# Patient Record
Sex: Female | Born: 1985 | Race: Black or African American | Hispanic: No | Marital: Single
Health system: Midwestern US, Community
[De-identification: ages and names within clinical notes are randomized; demographics above are authoritative.]

## PROBLEM LIST (undated history)

## (undated) DIAGNOSIS — K219 Gastro-esophageal reflux disease without esophagitis: Secondary | ICD-10-CM

## (undated) DIAGNOSIS — F32A Depression, unspecified: Secondary | ICD-10-CM

## (undated) DIAGNOSIS — D649 Anemia, unspecified: Secondary | ICD-10-CM

## (undated) DIAGNOSIS — R011 Cardiac murmur, unspecified: Secondary | ICD-10-CM

## (undated) DIAGNOSIS — B009 Herpesviral infection, unspecified: Secondary | ICD-10-CM

## (undated) DIAGNOSIS — Z91148 Patient's other noncompliance with medication regimen for other reason: Secondary | ICD-10-CM

## (undated) DIAGNOSIS — Z9114 Patient's other noncompliance with medication regimen: Secondary | ICD-10-CM

## (undated) DIAGNOSIS — R51 Headache: Secondary | ICD-10-CM

## (undated) DIAGNOSIS — J439 Emphysema, unspecified: Secondary | ICD-10-CM

## (undated) DIAGNOSIS — T7840XA Allergy, unspecified, initial encounter: Secondary | ICD-10-CM

## (undated) HISTORY — DX: Cardiac murmur, unspecified: R01.1

## (undated) HISTORY — DX: Headache: R51

## (undated) HISTORY — DX: Gastro-esophageal reflux disease without esophagitis: K21.9

## (undated) HISTORY — DX: Allergy, unspecified, initial encounter: T78.40XA

## (undated) HISTORY — DX: Herpesviral infection, unspecified: B00.9

## (undated) HISTORY — DX: Emphysema, unspecified: J43.9

## (undated) HISTORY — DX: Depression, unspecified: F32.A

## (undated) HISTORY — DX: Anemia, unspecified: D64.9

---

## 2010-08-05 ENCOUNTER — Emergency Department (HOSPITAL_COMMUNITY): Admission: EM | Admit: 2010-08-05 | Discharge: 2010-08-05 | Payer: Self-pay | Admitting: Emergency Medicine

## 2011-07-01 LAB — HM PAP SMEAR: HM Pap smear: NORMAL

## 2011-10-13 ENCOUNTER — Emergency Department (HOSPITAL_COMMUNITY)
Admission: EM | Admit: 2011-10-13 | Discharge: 2011-10-13 | Disposition: A | Discharge status: Home / Self Care | Payer: No Typology Code available for payment source | Attending: Emergency Medicine | Admitting: Emergency Medicine

## 2011-10-13 ENCOUNTER — Emergency Department (HOSPITAL_COMMUNITY): Payer: No Typology Code available for payment source

## 2011-10-13 DIAGNOSIS — Z87891 Personal history of nicotine dependence: Secondary | ICD-10-CM | POA: Insufficient documentation

## 2011-10-13 DIAGNOSIS — J45909 Unspecified asthma, uncomplicated: Secondary | ICD-10-CM | POA: Insufficient documentation

## 2011-10-13 MED ORDER — METHYLPREDNISOLONE SODIUM SUCC 125 MG IJ SOLR
INTRAMUSCULAR | Status: AC
  Administered 2011-10-13: 125 mg
  Filled 2011-10-13: qty 2

## 2011-10-13 MED ORDER — ALBUTEROL (5 MG/ML) CONTINUOUS INHALATION SOLN
5.0000 mg/h | INHALATION_SOLUTION | Freq: Once | RESPIRATORY_TRACT | Status: AC
  Administered 2011-10-13: 5 mg/h via RESPIRATORY_TRACT

## 2011-10-13 MED ORDER — METHYLPREDNISOLONE SODIUM SUCC 125 MG IJ SOLR: 125.0000 mg | Freq: Once | INTRAMUSCULAR | Status: DC

## 2011-10-13 MED ORDER — ALBUTEROL SULFATE (5 MG/ML) 0.5% IN NEBU
5.0000 mg | INHALATION_SOLUTION | RESPIRATORY_TRACT | Status: DC
  Filled 2011-10-13: qty 1

## 2011-10-13 MED ORDER — ALBUTEROL (5 MG/ML) CONTINUOUS INHALATION SOLN
10.0000 mg/h | INHALATION_SOLUTION | RESPIRATORY_TRACT | Status: DC
  Administered 2011-10-13: 10 mg/h via RESPIRATORY_TRACT
  Filled 2011-10-13: qty 20

## 2011-10-13 MED ORDER — IPRATROPIUM BROMIDE 0.02 % IN SOLN
RESPIRATORY_TRACT | Status: AC
  Administered 2011-10-13: 0.5 mg
  Filled 2011-10-13: qty 2.5

## 2011-10-13 NOTE — ED Notes (Signed)
Pt said that mother performed cpr for 10 min, then ems arrived and "coded pt" yesterday

## 2011-10-13 NOTE — Progress Notes (Signed)
Pt much improved mild exp wheezes

## 2011-10-13 NOTE — ED Provider Notes (Signed)
History     CSN: [id number] Arrival date & time: 10/13/2011  3:37 AM   First MD Initiated Contact with Patient 10/13/11 0344      Chief Complaint  Patient presents with  . Asthma    (Consider location/radiation/quality/duration/timing/severity/associated sxs/prior treatment) HPI Comments: Seen 0345.Patient with h/o asthma, using inhaler as needed, developed wheezing and shortness of breath 2 days ago. Was in acute distress two days ago requiring EMS intervention and transport to [facility]. There she was evaluated, diagnosed with asthma and bronchitis and discharged with prednisone, zithromax, and proventil. Tonight began wheezing again. Did not respond to proventil.  Patient is a 25 y.o. female presenting with asthma. The history is provided by the patient.  Asthma This is a chronic (Wheezing and shortness of breath x 2 days, h/o asthma) problem. The current episode started 1 to 2 hours ago. The problem has not changed since onset.Associated symptoms include shortness of breath. Pertinent negatives include no chest pain and no abdominal pain. The symptoms are aggravated by coughing and exertion (lying down). The symptoms are relieved by medications (usually responds to proventil. Tonight did not respond). Treatments tried: bronchodilator. The treatment provided no relief.    Past Medical History  Diagnosis Date  . Asthma     History reviewed. No pertinent past surgical history.  No family history on file.  History  Substance Use Topics  . Smoking status: Former Smoker    Types: Cigarettes  . Smokeless tobacco: Not on file  . Alcohol Use: Yes     occ    OB History    Grav Para Term Preterm Abortions TAB SAB Ect Mult Living                  Review of Systems  Respiratory: Positive for shortness of breath and wheezing.   Cardiovascular: Negative for chest pain.  Gastrointestinal: Negative for abdominal pain.  All other systems reviewed and are  negative.    Allergies  Review of patient's allergies indicates no known allergies.  Home Medications   Current Outpatient Rx  Name Route Sig Dispense Refill  . ALBUTEROL SULFATE HFA 108 (90 BASE) MCG/ACT IN AERS Inhalation Inhale 2 puffs into the lungs every 6 (six) hours as needed.      Marland Kitchen LORATADINE 10 MG PO TABS Oral Take 10 mg by mouth daily.      Marland Kitchen NORGESTIMATE-ETH ESTRADIOL 0.25-35 MG-MCG PO TABS Oral Take 1 tablet by mouth daily.      Marland Kitchen PREDNISOLONE 5 MG PO TABS Oral Take by mouth.        BP 128/100  Pulse 78  Temp(Src) 97.8 F (36.6 C) (Oral)  Resp 24  Ht 5\' 3"  (1.6 m)  Wt 194 lb (87.998 kg)  BMI 34.37 kg/m2  SpO2 98%  LMP 10/06/2011  Physical Exam  Nursing note and vitals reviewed. Constitutional: She is oriented to person, place, and time. She appears well-developed and well-nourished. She appears distressed.  HENT:  Head: Normocephalic and atraumatic.  Mouth/Throat: Oropharynx is clear and moist.  Eyes: EOM are normal.  Neck: Normal range of motion. Neck supple.  Cardiovascular: Normal rate, normal heart sounds and intact distal pulses.   Pulmonary/Chest: She is in respiratory distress. She has wheezes.       Poor air movement. Wheezing throughout.  Abdominal: Soft.  Musculoskeletal: Normal range of motion.  Neurological: She is alert and oriented to person, place, and time. She has normal reflexes.  Skin: Skin is warm and dry.  ED Course  Procedures (including critical care time)  Dg Chest Portable 1 View  10/13/2011  *RADIOLOGY REPORT*  Clinical Data: Shortness of breath; history of asthma.  PORTABLE CHEST - 1 VIEW  Comparison: None.  Findings: The lungs are well-aerated.  Peribronchial thickening is noted.  There is no evidence of focal opacification, pleural effusion or pneumothorax.  The cardiomediastinal silhouette is within normal limits.  No acute osseous abnormalities are seen.  IMPRESSION: Peribronchial thickening likely reflects the patient's  asthma; lungs otherwise clear.  Original Report Authenticated By: Tonia Ghent, M.D.    MDM  Patient with h/o asthma developed wheezing and significant shortness of breath with wheezing two days ago. Seen at an outside hospital and begun on treatment for asthma/bronchitis with antibiotics, prednisone, preventil. Developed worsening wheezing and shortness of breath tonight. Received an albuterol tretment with little improvement. Placed on continuous neb with gradual improvement. O2 sats from 92% on RA to 98% on RA. Pt feels improved after observation and/or treatment in ED.Pt stable in ED with no significant deterioration in condition.The patient appears reasonably screened and/or stabilized for discharge and I doubt any other medical condition or other [facility] requiring further screening, evaluation, or treatment in the ED at this time prior to discharge. MDM Reviewed: nursing note, vitals and previous chart Reviewed previous: x-ray Interpretation: x-ray Total time providing critical care: 50.           Nicoletta Dress. Colon Branch, MD 10/13/11 [phone]

## 2011-10-13 NOTE — ED Notes (Signed)
Started having trouble breathing yesterday, "i quit breathing for 20 min and they had to revive me", was seen at [facility] -told she had bronchitis, given meds.  Was feeling better, then began having trouble breathing again tonight,   Out of breathing treatments for 1 month, pmd will not give refill

## 2011-11-08 ENCOUNTER — Ambulatory Visit (INDEPENDENT_AMBULATORY_CARE_PROVIDER_SITE_OTHER): Payer: PRIVATE HEALTH INSURANCE | Admitting: Internal Medicine

## 2011-11-08 ENCOUNTER — Encounter: Payer: Self-pay | Admitting: Internal Medicine

## 2011-11-08 ENCOUNTER — Ambulatory Visit (INDEPENDENT_AMBULATORY_CARE_PROVIDER_SITE_OTHER)
Admission: RE | Admit: 2011-11-08 | Discharge: 2011-11-08 | Disposition: A | Discharge status: Home / Self Care | Payer: PRIVATE HEALTH INSURANCE | Source: Ambulatory Visit | Attending: Internal Medicine | Admitting: Internal Medicine

## 2011-11-08 VITALS — BP 126/82 | HR 95 | Temp 98.4°F | Resp 20 | Ht 63.0 in | Wt 203.5 lb

## 2011-11-08 DIAGNOSIS — Z23 Encounter for immunization: Secondary | ICD-10-CM

## 2011-11-08 DIAGNOSIS — R059 Cough, unspecified: Secondary | ICD-10-CM | POA: Insufficient documentation

## 2011-11-08 DIAGNOSIS — B360 Pityriasis versicolor: Secondary | ICD-10-CM | POA: Insufficient documentation

## 2011-11-08 DIAGNOSIS — J45909 Unspecified asthma, uncomplicated: Secondary | ICD-10-CM | POA: Insufficient documentation

## 2011-11-08 DIAGNOSIS — R05 Cough: Secondary | ICD-10-CM

## 2011-11-08 DIAGNOSIS — J209 Acute bronchitis, unspecified: Secondary | ICD-10-CM

## 2011-11-08 DIAGNOSIS — J45901 Unspecified asthma with (acute) exacerbation: Secondary | ICD-10-CM | POA: Insufficient documentation

## 2011-11-08 HISTORY — DX: Pityriasis versicolor: B36.0

## 2011-11-08 MED ORDER — KETOCONAZOLE 200 MG PO TABS: 200.0000 mg | ORAL_TABLET | Freq: Every day | ORAL | Status: AC

## 2011-11-08 MED ORDER — FLUTICASONE-SALMETEROL 250-50 MCG/DOSE IN AEPB: 1.0000 | INHALATION_SPRAY | Freq: Two times a day (BID) | RESPIRATORY_TRACT | Status: DC

## 2011-11-08 MED ORDER — CEFUROXIME AXETIL 500 MG PO TABS: 500.0000 mg | ORAL_TABLET | Freq: Two times a day (BID) | ORAL | Status: AC

## 2011-11-08 NOTE — Assessment & Plan Note (Signed)
Start ketoconazole orally

## 2011-11-08 NOTE — Assessment & Plan Note (Signed)
I will check a CXR to look for pna, mass, edema 

## 2011-11-08 NOTE — Assessment & Plan Note (Signed)
Her cough is productive of yellow/green phlegm so i asked her to start ceftin for what sounds like a bacterial pna or bronchitis

## 2011-11-08 NOTE — Assessment & Plan Note (Signed)
She was recently admitted in [place] and has improved some on albuterol and po prednisone but I think she should be on a LABA and ICS so I gave her samples of advair diskus

## 2011-11-08 NOTE — Patient Instructions (Signed)

## 2011-11-08 NOTE — Progress Notes (Signed)
Subjective:    Patient ID: Teresa Peterson, female    DOB: Jun 10, 1986, 25 y.o.   MRN: [id number]  Cough This is a recurrent problem. The current episode started 1 to 4 weeks ago. The problem has been gradually worsening. The problem occurs every few hours. The cough is productive of purulent sputum. Associated symptoms include chills, a rash, shortness of breath and wheezing. Pertinent negatives include no chest pain, ear congestion, ear pain, eye redness, fever, headaches, heartburn, hemoptysis, myalgias, nasal congestion, postnasal drip, rhinorrhea, sore throat, sweats or weight loss. The symptoms are aggravated by cold air. She has tried oral steroids and a beta-agonist inhaler for the symptoms. The treatment provided moderate relief. Her past medical history is significant for asthma.  Rash This is a new problem. The current episode started more than 1 month ago. The problem has been gradually worsening since onset. The affected locations include the left arm, right arm, torso, back and chest. Rash characteristics: pale spots without itching or pain. She was exposed to nothing. Associated symptoms include coughing and shortness of breath. Pertinent negatives include no anorexia, congestion, diarrhea, eye pain, facial edema, fatigue, fever, joint pain, nail changes, rhinorrhea, sore throat or vomiting. Past treatments include nothing. Her past medical history is significant for asthma.      Review of Systems  Constitutional: Positive for chills. Negative for fever, weight loss, diaphoresis, activity change, appetite change, fatigue and unexpected weight change.  HENT: Negative for ear pain, congestion, sore throat, facial swelling, rhinorrhea, sneezing, neck pain, neck stiffness, postnasal drip and sinus pressure.   Eyes: Negative for photophobia, pain, discharge, redness and visual disturbance.  Respiratory: Positive for cough, shortness of breath and wheezing. Negative for apnea,  hemoptysis, choking, chest tightness and stridor.   Cardiovascular: Negative for chest pain, palpitations and leg swelling.  Gastrointestinal: Negative for heartburn, vomiting, abdominal pain, diarrhea, constipation and anorexia.  Genitourinary: Negative for dysuria, frequency, hematuria, enuresis, difficulty urinating, genital sores and dyspareunia.  Musculoskeletal: Negative for myalgias, back pain, joint pain, joint swelling, arthralgias and gait problem.  Skin: Positive for rash. Negative for nail changes.  Neurological: Negative for dizziness, tremors, seizures, syncope, facial asymmetry, speech difficulty, weakness, light-headedness, numbness and headaches.  Hematological: Negative for adenopathy. Does not bruise/bleed easily.  Psychiatric/Behavioral: Negative.        Objective:   Physical Exam  Vitals reviewed. Constitutional: She is oriented to person, place, and time. She appears well-developed and well-nourished. No distress.  HENT:  Head: Normocephalic and atraumatic.  Mouth/Throat: Oropharynx is clear and moist. No oropharyngeal exudate.  Eyes: Conjunctivae are normal. Right eye exhibits no discharge. Left eye exhibits no discharge. No scleral icterus.  Neck: Normal range of motion. Neck supple. No JVD present. No tracheal deviation present. No thyromegaly present.  Cardiovascular: Normal rate, regular rhythm, normal heart sounds and intact distal pulses.  Exam reveals no gallop and no friction rub.   No murmur heard. Pulmonary/Chest: Effort normal. No accessory muscle usage or stridor. Not tachypneic. No respiratory distress. She has no decreased breath sounds. She has wheezes in the right middle field and the left middle field. She has no rhonchi. She has no rales.  Abdominal: Soft. Bowel sounds are normal. She exhibits no distension and no mass. There is no tenderness. There is no rebound and no guarding.  Musculoskeletal: Normal range of motion. She exhibits no edema and no  tenderness.  Lymphadenopathy:    She has no cervical adenopathy.  Neurological: She is oriented to person, place,  and time. She displays normal reflexes. She exhibits normal muscle tone.  Skin: Skin is warm and dry. Rash noted. No abrasion, no bruising, no burn, no ecchymosis, no laceration, no lesion, no petechiae and no purpura noted. Rash is macular. Rash is not papular, not nodular, not pustular, not vesicular and not urticarial. She is not diaphoretic. No cyanosis or erythema. Nails show no clubbing.       She has diffuse polygonal depigmented macules scattered over her upper arms, torso, back, and chest  Psychiatric: She has a normal mood and affect. Her behavior is normal. Judgment and thought content normal.          Assessment & Plan:

## 2011-11-29 ENCOUNTER — Other Ambulatory Visit (INDEPENDENT_AMBULATORY_CARE_PROVIDER_SITE_OTHER): Payer: PRIVATE HEALTH INSURANCE

## 2011-11-29 ENCOUNTER — Encounter: Payer: Self-pay | Admitting: Internal Medicine

## 2011-11-29 ENCOUNTER — Ambulatory Visit (INDEPENDENT_AMBULATORY_CARE_PROVIDER_SITE_OTHER): Payer: PRIVATE HEALTH INSURANCE | Admitting: Internal Medicine

## 2011-11-29 DIAGNOSIS — R079 Chest pain, unspecified: Secondary | ICD-10-CM

## 2011-11-29 DIAGNOSIS — R0602 Shortness of breath: Secondary | ICD-10-CM

## 2011-11-29 DIAGNOSIS — J45901 Unspecified asthma with (acute) exacerbation: Secondary | ICD-10-CM

## 2011-11-29 DIAGNOSIS — R05 Cough: Secondary | ICD-10-CM

## 2011-11-29 DIAGNOSIS — B360 Pityriasis versicolor: Secondary | ICD-10-CM

## 2011-11-29 LAB — CBC WITH DIFFERENTIAL/PLATELET
Basophils Absolute: 0 10*3/uL (ref 0.0–0.1)
Basophils Relative: 0.5 % (ref 0.0–3.0)
Eosinophils Absolute: 1.3 10*3/uL — ABNORMAL HIGH (ref 0.0–0.7)
Eosinophils Relative: 17.1 % — ABNORMAL HIGH (ref 0.0–5.0)
HCT: 36.5 % (ref 36.0–46.0)
Hemoglobin: 12 g/dL (ref 12.0–15.0)
Lymphocytes Relative: 25.3 % (ref 12.0–46.0)
Lymphs Abs: 1.9 10*3/uL (ref 0.7–4.0)
MCHC: 32.8 g/dL (ref 30.0–36.0)
MCV: 88.6 fl (ref 78.0–100.0)
Monocytes Absolute: 0.6 10*3/uL (ref 0.1–1.0)
Monocytes Relative: 8.6 % (ref 3.0–12.0)
Neutro Abs: 3.6 10*3/uL (ref 1.4–7.7)
Neutrophils Relative %: 48.5 % (ref 43.0–77.0)
Platelets: 332 10*3/uL (ref 150.0–400.0)
RBC: 4.12 Mil/uL (ref 3.87–5.11)
RDW: 13.6 % (ref 11.5–14.6)
WBC: 7.4 10*3/uL (ref 4.5–10.5)

## 2011-11-29 LAB — COMPREHENSIVE METABOLIC PANEL
ALT: 25 U/L (ref 0–35)
AST: 26 U/L (ref 0–37)
Alkaline Phosphatase: 49 U/L (ref 39–117)
Creatinine, Ser: 0.7 mg/dL (ref 0.4–1.2)
Sodium: 140 mEq/L (ref 135–145)
Total Bilirubin: 0.7 mg/dL (ref 0.3–1.2)
Total Protein: 7.2 g/dL (ref 6.0–8.3)

## 2011-11-29 LAB — BRAIN NATRIURETIC PEPTIDE: Pro B Natriuretic peptide (BNP): 5 pg/mL (ref 0.0–100.0)

## 2011-11-29 LAB — TSH: TSH: 1.35 u[IU]/mL (ref 0.35–5.50)

## 2011-11-29 MED ORDER — ALBUTEROL SULFATE HFA 108 (90 BASE) MCG/ACT IN AERS: 2.0000 | INHALATION_SPRAY | Freq: Four times a day (QID) | RESPIRATORY_TRACT | Status: DC | PRN

## 2011-11-29 MED ORDER — MONTELUKAST SODIUM 10 MG PO TABS: 10.0000 mg | ORAL_TABLET | Freq: Every day | ORAL | Status: DC

## 2011-11-29 MED ORDER — BUDESONIDE-FORMOTEROL FUMARATE 160-4.5 MCG/ACT IN AERO: 2.0000 | INHALATION_SPRAY | Freq: Two times a day (BID) | RESPIRATORY_TRACT | Status: DC

## 2011-11-29 NOTE — Assessment & Plan Note (Signed)
Her recent CXR was normal so I think the cough is related to asthma

## 2011-11-29 NOTE — Assessment & Plan Note (Signed)
Her EKG is normal so I don't think her pain is cardiac in nature, she has risk factors for PE so will check a d-dimer, also will check a BNP to look for fluid overload

## 2011-11-29 NOTE — Assessment & Plan Note (Signed)
The SOB may be related to the asthma but she feels like something else may be going on so I will check her for anemia, renal failure, hypothyroidism, etc

## 2011-11-29 NOTE — Assessment & Plan Note (Signed)
She has persistent symptoms so I will add singulair and she has only been using the albuterol about 1x per day so I asked her to try using that more frequently, also will change to symbicort at her request

## 2011-11-29 NOTE — Patient Instructions (Signed)

## 2011-11-29 NOTE — Assessment & Plan Note (Signed)
This has resolved.

## 2011-11-29 NOTE — Progress Notes (Signed)
Subjective:    Patient ID: Teresa Peterson, female    DOB: 09-Sep-1986, 25 y.o.   MRN: [id number]  Chest Pain  This is a new problem. Episode onset: for 2 weeks. The onset quality is gradual. The problem occurs intermittently. The problem has been unchanged. The pain is present in the lateral region. The pain is at a severity of 1/10. The pain is mild. The quality of the pain is described as sharp. The pain does not radiate. Associated symptoms include a cough, malaise/fatigue and shortness of breath. Pertinent negatives include no abdominal pain, back pain, claudication, diaphoresis, dizziness, exertional chest pressure, fever, headaches, hemoptysis, irregular heartbeat, leg pain, lower extremity edema, nausea, near-syncope, numbness, orthopnea, palpitations, PND, sputum production, syncope, vomiting or weakness. The cough is non-productive. The cough is relieved by a beta-agonist. The pain is aggravated by nothing. She has tried nothing for the symptoms. The treatment provided mild relief. Risk factors include oral contraceptive use, sedentary lifestyle and obesity.  Pertinent negatives for past medical history include no seizures.  Asthma She complains of chest tightness, cough, difficulty breathing, shortness of breath and wheezing. There is no hemoptysis, hoarse voice or sputum production. This is a recurrent problem. The current episode started more than 1 month ago. The problem occurs intermittently. The problem has been gradually worsening. The cough is non-productive. Associated symptoms include chest pain, dyspnea on exertion and malaise/fatigue. Pertinent negatives include no appetite change, ear congestion, ear pain, fever, headaches, heartburn, myalgias, nasal congestion, orthopnea, PND, postnasal drip, rhinorrhea, sneezing, sore throat, sweats, trouble swallowing or weight loss. Her symptoms are aggravated by nothing. Her symptoms are alleviated by beta-agonist. She reports minimal  improvement on treatment. Her past medical history is significant for asthma.      Review of Systems  Constitutional: Positive for malaise/fatigue. Negative for fever, chills, weight loss, diaphoresis, activity change, appetite change, fatigue and unexpected weight change.  HENT: Negative for ear pain, sore throat, hoarse voice, facial swelling, rhinorrhea, sneezing, trouble swallowing, neck pain, neck stiffness, voice change, postnasal drip and sinus pressure.   Eyes: Negative.   Respiratory: Positive for cough, chest tightness, shortness of breath and wheezing. Negative for apnea, hemoptysis, sputum production, choking and stridor.   Cardiovascular: Positive for chest pain and dyspnea on exertion. Negative for palpitations, orthopnea, claudication, syncope, PND and near-syncope.  Gastrointestinal: Negative for heartburn, nausea, vomiting, abdominal pain, diarrhea, constipation and blood in stool.  Genitourinary: Negative for dysuria, urgency, frequency, hematuria, decreased urine volume, enuresis, difficulty urinating and dyspareunia.  Musculoskeletal: Negative for myalgias, back pain, joint swelling, arthralgias and gait problem.  Skin: Negative for color change, pallor, rash and wound.  Neurological: Negative for dizziness, tremors, seizures, syncope, facial asymmetry, speech difficulty, weakness, light-headedness, numbness and headaches.  Hematological: Negative for adenopathy. Does not bruise/bleed easily.  Psychiatric/Behavioral: Negative.        Objective:   Physical Exam  Vitals reviewed. Constitutional: She is oriented to person, place, and time. She appears well-developed and well-nourished. No distress.  HENT:  Head: Normocephalic and atraumatic.  Mouth/Throat: Oropharynx is clear and moist. No oropharyngeal exudate.  Eyes: Conjunctivae are normal. Right eye exhibits no discharge. Left eye exhibits no discharge. No scleral icterus.  Neck: Normal range of motion. Neck supple.  No JVD present. No tracheal deviation present. No thyromegaly present.  Cardiovascular: Normal rate, regular rhythm, normal heart sounds and intact distal pulses.  Exam reveals no gallop and no friction rub.   No murmur heard. Pulmonary/Chest: Effort normal and breath sounds  normal. No stridor. No respiratory distress. She has no wheezes. She has no rales. She exhibits no tenderness.  Abdominal: Soft. Bowel sounds are normal. She exhibits no distension and no mass. There is no tenderness. There is no rebound and no guarding.  Musculoskeletal: Normal range of motion. She exhibits no edema and no tenderness.  Lymphadenopathy:    She has no cervical adenopathy.  Neurological: She is oriented to person, place, and time.  Skin: Skin is warm and dry. No rash noted. She is not diaphoretic. No erythema. No pallor.  Psychiatric: She has a normal mood and affect. Her behavior is normal. Judgment and thought content normal.          Assessment & Plan:

## 2011-12-01 ENCOUNTER — Encounter: Payer: Self-pay | Admitting: Internal Medicine

## 2012-04-02 ENCOUNTER — Encounter (HOSPITAL_COMMUNITY): Payer: Self-pay | Admitting: *Deleted

## 2012-04-02 ENCOUNTER — Emergency Department (HOSPITAL_COMMUNITY)
Admission: EM | Admit: 2012-04-02 | Discharge: 2012-04-02 | Disposition: A | Discharge status: Home / Self Care | Payer: Self-pay | Attending: Emergency Medicine | Admitting: Emergency Medicine

## 2012-04-02 DIAGNOSIS — J438 Other emphysema: Secondary | ICD-10-CM | POA: Insufficient documentation

## 2012-04-02 DIAGNOSIS — Z87891 Personal history of nicotine dependence: Secondary | ICD-10-CM | POA: Insufficient documentation

## 2012-04-02 DIAGNOSIS — R0602 Shortness of breath: Secondary | ICD-10-CM | POA: Insufficient documentation

## 2012-04-02 DIAGNOSIS — J45909 Unspecified asthma, uncomplicated: Secondary | ICD-10-CM | POA: Insufficient documentation

## 2012-04-02 DIAGNOSIS — Z9114 Patient's other noncompliance with medication regimen: Secondary | ICD-10-CM

## 2012-04-02 MED ORDER — IPRATROPIUM BROMIDE 0.02 % IN SOLN: 0.5000 mg | Freq: Once | RESPIRATORY_TRACT | Status: DC

## 2012-04-02 MED ORDER — ALBUTEROL SULFATE (5 MG/ML) 0.5% IN NEBU: 5.0000 mg | INHALATION_SOLUTION | Freq: Once | RESPIRATORY_TRACT | Status: DC

## 2012-04-02 NOTE — ED Provider Notes (Cosign Needed)
History     CSN: [id number]  Arrival date & time 04/02/12  1478   First MD Initiated Contact with Patient 04/02/12 1920      Chief Complaint  Patient presents with  . Shortness of Breath    (Consider location/radiation/quality/duration/timing/severity/associated sxs/prior treatment) HPI Patient with symptoms of sinus problems and seasonal allergies with feeling like she is going to have an asthma attack for two week.  Out of inhaler for a month.  Takes zyrtec for seasonal allergies.  Cough with white sputum, no fever.  No nausea , vomiting or diarrhea.  PMD- none. Normal menses, no pregnancies, no bcp, lmp two weeks ago.  Past Medical History  Diagnosis Date  . Asthma   . Emphysema of lung   . Heart murmur   . Headache     Migraines    History reviewed. No pertinent past surgical history.  Family History  Problem Relation Age of Onset  . Alcohol abuse Other     grandparents  . Arthritis Other     grandparent  . Cancer Other     relative  . Stroke Other     grandparent  . Kidney disease Other     grandparents  . Diabetes Other     grandparent  . Asthma Brother     History  Substance Use Topics  . Smoking status: Former Smoker    Types: Cigarettes    Quit date: 07/08/2011  . Smokeless tobacco: Not on file  . Alcohol Use: No     occ    OB History    Grav Para Term Preterm Abortions TAB SAB Ect Mult Living                  Review of Systems  All other systems reviewed and are negative.    Allergies  Review of patient's allergies indicates no known allergies.  Home Medications   Current Outpatient Rx  Name Route Sig Dispense Refill  . ALBUTEROL SULFATE HFA 108 (90 BASE) MCG/ACT IN AERS Inhalation Inhale 2 puffs into the lungs every 6 (six) hours as needed. 1 Inhaler 11  . BUDESONIDE-FORMOTEROL FUMARATE 160-4.5 MCG/ACT IN AERO Inhalation Inhale 2 puffs into the lungs 2 (two) times daily. 1 Inhaler 0  . KETOCONAZOLE 200 MG PO TABS Oral Take 1  tablet (200 mg total) by mouth daily. 3 tablet 1  . LORATADINE 10 MG PO TABS Oral Take 10 mg by mouth daily.      . METHYLPREDNISOLONE 4 MG PO KIT Oral Take 4 mg by mouth 2 (two) times daily. follow package directions     . MONTELUKAST SODIUM 10 MG PO TABS Oral Take 1 tablet (10 mg total) by mouth daily. 30 tablet 11  . NORGESTIMATE-ETH ESTRADIOL 0.25-35 MG-MCG PO TABS Oral Take 1 tablet by mouth daily.      Marland Kitchen PREDNISOLONE 5 MG PO TABS Oral Take 10 mg by mouth 2 (two) times daily.       BP 113/91  Pulse 87  Temp(Src) 97.9 F (36.6 C) (Oral)  Resp 18  Ht 5\' 4"  (1.626 m)  Wt 200 lb (90.719 kg)  BMI 34.33 kg/m2  SpO2 93%  LMP 03/12/2012  Physical Exam  Nursing note and vitals reviewed. Constitutional: She appears well-developed and well-nourished.       Morbidly obese  HENT:  Head: Normocephalic and atraumatic.  Eyes: Conjunctivae are normal.  Neck: Normal range of motion. Neck supple.  Pulmonary/Chest: Breath sounds normal.    ED  Course  Procedures (including critical care time)  Labs Reviewed - No data to display No results found.   No diagnosis found.    MDM  Mother became angry during exam and stated that my facial expressions were rude.  I explained that I was only trying to show the patient what I was asking when I instructed her to open her mouth for deep breaths. She states these were unprofessional and "can't you see she's in respiratory distress?"  Patient states it hurst her throat to breath through it- I asked if she has had a sore throat, and she replied "it's burning".  Mother states we will go to [facility] and left with patient.        Hilario Quarry, MD 04/02/12 [phone]

## 2012-04-02 NOTE — ED Notes (Signed)
Pt reports sob for the last 2 weeks. States does not have a PCP, & no meds being prescribed to her.

## 2012-04-02 NOTE — ED Notes (Signed)
Returned to desk to find pt left after speaking to the EDP. Pt did not wait for breathing tx that had been ordered. Pt left & was unavailiable to sign AMA form.

## 2012-04-02 NOTE — ED Notes (Signed)
Respiratory paged for a breathing treatment at this time.  

## 2012-04-02 NOTE — ED Notes (Signed)
Pt left AMA after speaking w/ EDP.

## 2012-04-02 NOTE — ED Notes (Signed)
Short of breath for the past 2 weeks 

## 2015-07-25 ENCOUNTER — Encounter (HOSPITAL_COMMUNITY): Payer: Self-pay | Admitting: Cardiology

## 2015-07-25 ENCOUNTER — Emergency Department (HOSPITAL_COMMUNITY)
Admission: EM | Admit: 2015-07-25 | Discharge: 2015-07-25 | Disposition: A | Discharge status: Home / Self Care | Payer: PRIVATE HEALTH INSURANCE | Attending: Emergency Medicine | Admitting: Emergency Medicine

## 2015-07-25 DIAGNOSIS — R011 Cardiac murmur, unspecified: Secondary | ICD-10-CM | POA: Insufficient documentation

## 2015-07-25 DIAGNOSIS — S46911A Strain of unspecified muscle, fascia and tendon at shoulder and upper arm level, right arm, initial encounter: Secondary | ICD-10-CM | POA: Diagnosis not present

## 2015-07-25 DIAGNOSIS — Y998 Other external cause status: Secondary | ICD-10-CM | POA: Diagnosis not present

## 2015-07-25 DIAGNOSIS — Y9389 Activity, other specified: Secondary | ICD-10-CM | POA: Insufficient documentation

## 2015-07-25 DIAGNOSIS — Z79899 Other long term (current) drug therapy: Secondary | ICD-10-CM | POA: Diagnosis not present

## 2015-07-25 DIAGNOSIS — Z7951 Long term (current) use of inhaled steroids: Secondary | ICD-10-CM | POA: Diagnosis not present

## 2015-07-25 DIAGNOSIS — J45909 Unspecified asthma, uncomplicated: Secondary | ICD-10-CM | POA: Diagnosis not present

## 2015-07-25 DIAGNOSIS — X58XXXA Exposure to other specified factors, initial encounter: Secondary | ICD-10-CM | POA: Insufficient documentation

## 2015-07-25 DIAGNOSIS — Y9289 Other specified places as the place of occurrence of the external cause: Secondary | ICD-10-CM | POA: Diagnosis not present

## 2015-07-25 DIAGNOSIS — Z7952 Long term (current) use of systemic steroids: Secondary | ICD-10-CM | POA: Insufficient documentation

## 2015-07-25 DIAGNOSIS — Z87891 Personal history of nicotine dependence: Secondary | ICD-10-CM | POA: Diagnosis not present

## 2015-07-25 DIAGNOSIS — S4991XA Unspecified injury of right shoulder and upper arm, initial encounter: Secondary | ICD-10-CM | POA: Diagnosis present

## 2015-07-25 MED ORDER — KETOROLAC TROMETHAMINE 10 MG PO TABS
10.0000 mg | ORAL_TABLET | Freq: Once | ORAL | Status: AC
  Administered 2015-07-25: 10 mg via ORAL
  Filled 2015-07-25: qty 1

## 2015-07-25 MED ORDER — IBUPROFEN 600 MG PO TABS: 600.0000 mg | ORAL_TABLET | Freq: Four times a day (QID) | ORAL | Status: DC

## 2015-07-25 MED ORDER — ACETAMINOPHEN 325 MG PO TABS
650.0000 mg | ORAL_TABLET | Freq: Once | ORAL | Status: AC
  Administered 2015-07-25: 650 mg via ORAL
  Filled 2015-07-25: qty 2

## 2015-07-25 MED ORDER — METHOCARBAMOL 500 MG PO TABS
1000.0000 mg | ORAL_TABLET | Freq: Once | ORAL | Status: AC
  Administered 2015-07-25: 1000 mg via ORAL
  Filled 2015-07-25: qty 2

## 2015-07-25 MED ORDER — METHOCARBAMOL 500 MG PO TABS: 500.0000 mg | ORAL_TABLET | Freq: Three times a day (TID) | ORAL | Status: DC

## 2015-07-25 NOTE — ED Notes (Signed)
Right shoulder  Pain since 6 am.

## 2015-07-25 NOTE — ED Provider Notes (Signed)
CSN: [id number]     Arrival date & time 07/25/15  8295 History   First MD Initiated Contact with Patient 07/25/15 0900     Chief Complaint  Patient presents with  . Shoulder Pain     (Consider location/radiation/quality/duration/timing/severity/associated sxs/prior Treatment) HPI Comments: Pt reports lifting heavy objects on yesterday. She has pain in the right shoulder  increasing since that time  Patient is a 29 y.o. female presenting with shoulder pain. The history is provided by the patient.  Shoulder Pain Location:  Shoulder Time since incident:  3 hours Injury: no   Shoulder location:  R shoulder Pain details:    Quality:  Aching   Severity:  Moderate   Onset quality:  Gradual   Duration:  3 hours   Timing:  Intermittent   Progression:  Worsening Chronicity:  New Handedness:  Right-handed Dislocation: no   Prior injury to area:  No Relieved by:  Being still Worsened by:  Movement Ineffective treatments:  None tried Associated symptoms: stiffness   Associated symptoms: no back pain, no fever, no neck pain, no numbness and no tingling   Risk factors: no known bone disorder, no frequent fractures and no recent illness     Past Medical History  Diagnosis Date  . Asthma   . Emphysema of lung   . Heart murmur   . Headache(784.0)     Migraines   History reviewed. No pertinent past surgical history. Family History  Problem Relation Age of Onset  . Alcohol abuse Other     grandparents  . Arthritis Other     grandparent  . Cancer Other     relative  . Stroke Other     grandparent  . Kidney disease Other     grandparents  . Diabetes Other     grandparent  . Asthma Brother    History  Substance Use Topics  . Smoking status: Former Smoker    Types: Cigarettes    Quit date: 07/08/2011  . Smokeless tobacco: Not on file  . Alcohol Use: No     Comment: occ   OB History    No data available     Review of Systems  Constitutional: Negative for fever.   Musculoskeletal: Positive for arthralgias and stiffness. Negative for back pain and neck pain.  Neurological: Positive for headaches.  All other systems reviewed and are negative.     Allergies  Sudafed  Home Medications   Prior to Admission medications   Medication Sig Start Date End Date Taking? Authorizing Provider  albuterol (PROVENTIL HFA;VENTOLIN HFA) 108 (90 BASE) MCG/ACT inhaler Inhale 2 puffs into the lungs every 6 (six) hours as needed. 11/29/11   Etta Grandchild, MD  budesonide-formoterol [facility]) 160-4.5 MCG/ACT inhaler Inhale 2 puffs into the lungs 2 (two) times daily. 11/29/11 11/28/12  Etta Grandchild, MD  loratadine (CLARITIN) 10 MG tablet Take 10 mg by mouth daily.      Historical Provider, MD  methylPREDNISolone (MEDROL DOSEPAK) 4 MG tablet Take 4 mg by mouth 2 (two) times daily. follow package directions     Historical Provider, MD  montelukast (SINGULAIR) 10 MG tablet Take 1 tablet (10 mg total) by mouth daily. 11/29/11 11/28/12  Etta Grandchild, MD  norgestimate-ethinyl estradiol (ORTHO-CYCLEN,SPRINTEC,PREVIFEM) 0.25-35 MG-MCG tablet Take 1 tablet by mouth daily.      Historical Provider, MD  prednisoLONE 5 MG TABS Take 10 mg by mouth 2 (two) times daily.     Historical Provider, MD  BP 129/61 mmHg  Pulse 84  Temp(Src) 98.3 F (36.8 C) (Oral)  Resp 16  Ht  (1.6 m)  Wt 188 lb (85.276 kg)  BMI 33.31 kg/m2  SpO2 98%  LMP 07/24/2015 Physical Exam  Constitutional: She is oriented to person, place, and time. She appears well-developed and well-nourished.  Non-toxic appearance.  HENT:  Head: Normocephalic.  Right Ear: Tympanic membrane and external ear normal.  Left Ear: Tympanic membrane and external ear normal.  Eyes: EOM and lids are normal. Pupils are equal, round, and reactive to light.  Neck: Normal range of motion. Neck supple. Carotid bruit is not present.  Cardiovascular: Normal rate, regular rhythm, normal heart sounds, intact distal pulses and  normal pulses.   Pulmonary/Chest: Breath sounds normal. No respiratory distress.  Abdominal: Soft. Bowel sounds are normal. There is no tenderness. There is no guarding.  Musculoskeletal:       Right shoulder: She exhibits decreased range of motion, tenderness and spasm. She exhibits no effusion and no deformity.  Right shoulder is not hot. Pain of the right anterior shoulder, extending to the lower neck.  Lymphadenopathy:       Head (right side): No submandibular adenopathy present.       Head (left side): No submandibular adenopathy present.    She has no cervical adenopathy.  Neurological: She is alert and oriented to person, place, and time. She has normal strength. No cranial nerve deficit or sensory deficit.  Skin: Skin is warm and dry.  Psychiatric: She has a normal mood and affect. Her speech is normal.  Nursing note and vitals reviewed.   ED Course  Procedures (including critical care time) Labs Review Labs Reviewed - No data to display  Imaging Review No results found.   EKG Interpretation None      MDM  Examination favors shoulder strain. Vital signs are within normal limits. No gross neurovascular deficits appreciated. Patient is fitted with a sling, she will be treated with ibuprofen 600 mg 4 times a day, Robaxin, and she will use Tylenol in between the ibuprofen doses if needed for discomfort. Patient is referred to orthopedics for additional evaluation of her shoulder pain. Work note is given for the patient to return on Friday, August 5 .   Final diagnoses:  None    *I have reviewed nursing notes, vital signs, and all appropriate lab and imaging results for this patient.[address], PA-C 07/25/15 9528  Blane Ohara, MD 07/25/15 [phone]

## 2015-07-25 NOTE — Discharge Instructions (Signed)
Please use your sling over the next 3 or 4 days. Please see the orthopedic specialist listed above, or the orthopedic specialist of your choice if pain is not improving. Please use ibuprofen 600 mg with breakfast, lunch, dinner, and bedtime. Use Tylenol Extra Strength in between the ibuprofen doses if needed. Please use Robaxin 3 times daily for the muscle spasm and pain related to your shoulder issue. Robaxin may cause drowsiness, please do not drive, drink alcohol, operate machinery, or participate in activities requiring concentration when taking this medication. Shoulder Sprain A shoulder sprain is the result of damage to the tough, fiber-like tissues (ligaments) that help hold your shoulder in place. The ligaments may be stretched or torn. Besides the main shoulder joint (the ball and socket), there are several smaller joints that connect the bones in this area. A sprain usually involves one of those joints. Most often it is the acromioclavicular (or AC) joint. That is the joint that connects the collarbone (clavicle) and the shoulder blade (scapula) at the top point of the shoulder blade (acromion). A shoulder sprain is a mild form of what is called a shoulder separation. Recovering from a shoulder sprain may take some time. For some, pain lingers for several months. Most people recover without long term problems. CAUSES   A shoulder sprain is usually caused by some kind of trauma. This might be:  Falling on an outstretched arm.  Being hit hard on the shoulder.  Twisting the arm.  Shoulder sprains are more likely to occur in people who:  Play sports.  Have balance or coordination problems. SYMPTOMS   Pain when you move your shoulder.  Limited ability to move the shoulder.  Swelling and tenderness on top of the shoulder.  Redness or warmth in the shoulder.  Bruising.  A change in the shape of the shoulder. DIAGNOSIS  Your healthcare provider may:  Ask about your  symptoms.  Ask about recent activity that might have caused those symptoms.  Examine your shoulder. You may be asked to do simple exercises to test movement. The other shoulder will be examined for comparison.  Order some tests that provide a look inside the body. They can show the extent of the injury. The tests could include:  X-rays.  CT (computed tomography) scan.  MRI (magnetic resonance imaging) scan. RISKS AND COMPLICATIONS  Loss of full shoulder motion.  Ongoing shoulder pain. TREATMENT  How long it takes to recover from a shoulder sprain depends on how severe it was. Treatment options may include:  Rest. You should not use the arm or shoulder until it heals.  Ice. For 2 or 3 days after the injury, put an ice pack on the shoulder up to 4 times a day. It should stay on for 15 to 20 minutes each time. Wrap the ice in a towel so it does not touch your skin.  Over-the-counter medicine to relieve pain.  A sling or brace. This will keep the arm still while the shoulder is healing.  Physical therapy or rehabilitation exercises. These will help you regain strength and motion. Ask your healthcare provider when it is OK to begin these exercises.  Surgery. The need for surgery is rare with a sprained shoulder, but some people may need surgery to keep the joint in place and reduce pain. HOME CARE INSTRUCTIONS   Ask your healthcare provider about what you should and should not do while your shoulder heals.  Make sure you know how to apply ice to the  correct area of your shoulder.  Talk with your healthcare provider about which medications should be used for pain and swelling.  If rehabilitation therapy will be needed, ask your healthcare provider to refer you to a therapist. If it is not recommended, then ask about at-home exercises. Find out when exercise should begin. SEEK MEDICAL CARE IF:  Your pain, swelling, or redness at the joint increases. SEEK IMMEDIATE MEDICAL CARE IF:    You have a fever.  You cannot move your arm or shoulder. Document Released: 04/27/2009 Document Revised: 03/02/2012 Document Reviewed: 04/27/2009 [facility] Patient Information 2015 [place], [place]. This information is not intended to replace advice given to you by your health care provider. Make sure you discuss any questions you have with your health care provider.

## 2015-08-14 ENCOUNTER — Encounter: Payer: Self-pay | Admitting: Orthopedic Surgery

## 2015-08-17 ENCOUNTER — Ambulatory Visit: Payer: PRIVATE HEALTH INSURANCE | Admitting: Orthopedic Surgery

## 2015-09-07 ENCOUNTER — Ambulatory Visit (INDEPENDENT_AMBULATORY_CARE_PROVIDER_SITE_OTHER): Payer: PRIVATE HEALTH INSURANCE

## 2015-09-07 ENCOUNTER — Ambulatory Visit (INDEPENDENT_AMBULATORY_CARE_PROVIDER_SITE_OTHER): Payer: PRIVATE HEALTH INSURANCE | Admitting: Orthopedic Surgery

## 2015-09-07 ENCOUNTER — Encounter: Payer: Self-pay | Admitting: Orthopedic Surgery

## 2015-09-07 VITALS — BP 152/93 | Ht 63.0 in | Wt 188.0 lb

## 2015-09-07 DIAGNOSIS — M25511 Pain in right shoulder: Secondary | ICD-10-CM

## 2015-09-07 DIAGNOSIS — M75101 Unspecified rotator cuff tear or rupture of right shoulder, not specified as traumatic: Secondary | ICD-10-CM

## 2015-09-07 MED ORDER — NAPROXEN 500 MG PO TABS: 500.0000 mg | ORAL_TABLET | Freq: Two times a day (BID) | ORAL | Status: DC

## 2015-09-07 MED ORDER — METHOCARBAMOL 500 MG PO TABS: 500.0000 mg | ORAL_TABLET | Freq: Three times a day (TID) | ORAL | Status: DC | PRN

## 2015-09-07 NOTE — Progress Notes (Signed)
Patient ID: Teresa Teresa Peterson, female   DOB: 10-May-1986, 29 y.o.   MRN: [id number]  Chief Complaint  Patient presents with  . Shoulder Pain    ER follow up for right shoulder pain.    HPI Teresa DEVENPORT is a 29 y.o. female.  The patient presents with a long history of difficulties with his right shoulder. This first began 6 years ago she was fighting with her brother playfully he pushed her arm she felt severe pain it was sore for a few weeks and went away. Then she fell on her job last year had some shoulder pain for about a month that resolved. On this occasion after unloading a truck she started having right shoulder pain presents to the ER complaining of. Acromial dull aching severe pain acute onset. She has pain when she brings her arm down from Teresa Peterson elevated position. No previous treatment other than methocarbamol and ibuprofen seeming to get more relief from the muscle relaxer  She denies fever chills numbness or tingling under review of systems HPI  Review of Systems Review of Systems   Past Medical History  Diagnosis Date  . Asthma   . Emphysema of lung   . Heart murmur   . Headache(784.0)     Migraines    No past surgical history on file.  Family History  Problem Relation Age of Onset  . Alcohol abuse Other     grandparents  . Arthritis Other     grandparent  . Cancer Other     relative  . Stroke Other     grandparent  . Kidney disease Other     grandparents  . Diabetes Other     grandparent  . Asthma Brother     Social History Social History  Substance Use Topics  . Smoking status: Former Smoker    Types: Cigarettes    Quit date: 07/08/2011  . Smokeless tobacco: Not on file  . Alcohol Use: No     Comment: occ    Allergies  Allergen Reactions  . Sudafed [Pseudoephedrine Hcl] Itching    Current Outpatient Prescriptions  Medication Sig Dispense Refill  . albuterol (PROVENTIL HFA;VENTOLIN HFA) 108 (90 BASE) MCG/ACT inhaler Inhale 2 puffs  into the lungs every 6 (six) hours as needed. 1 Inhaler 11  . budesonide-formoterol (SYMBICORT) 160-4.5 MCG/ACT inhaler Inhale 2 puffs into the lungs 2 (two) times daily. 1 Inhaler 0  . ibuprofen (ADVIL,MOTRIN) 600 MG tablet Take 1 tablet (600 mg total) by mouth 4 (four) times daily. 30 tablet 0  . loratadine (CLARITIN) 10 MG tablet Take 10 mg by mouth daily.      . methocarbamol (ROBAXIN) 500 MG tablet Take 1 tablet (500 mg total) by mouth 3 (three) times daily. 21 tablet 0  . methocarbamol (ROBAXIN) 500 MG tablet Take 1 tablet (500 mg total) by mouth every 8 (eight) hours as needed for muscle spasms. 90 tablet 0  . methylPREDNISolone (MEDROL DOSEPAK) 4 MG tablet Take 4 mg by mouth 2 (two) times daily. follow package directions     . montelukast (SINGULAIR) 10 MG tablet Take 1 tablet (10 mg total) by mouth daily. 30 tablet 11  . naproxen (NAPROSYN) 500 MG tablet Take 1 tablet (500 mg total) by mouth 2 (two) times daily with a meal. 60 tablet 1  . norgestimate-ethinyl estradiol (ORTHO-CYCLEN,SPRINTEC,PREVIFEM) 0.25-35 MG-MCG tablet Take 1 tablet by mouth daily.      . prednisoLONE 5 MG TABS Take 10 mg by mouth 2 (  two) times daily.      No current facility-administered medications for this visit.       Physical Exam Blood pressure 152/93, height 5\' 3"  (1.6 m), weight 188 lb (85.276 kg), last menstrual period 08/15/2015. Physical Exam The patient is well developed well nourished and well groomed. Orientation to person place and time is normal  Mood is pleasant. Ambulatory status normal ambulatory status. Cervical spine exam is as follows nontender full range of motion in the cervical spine normal lymph nodes Right shoulder impingement sign is positive Perry acromial tenderness is noted rotator interval tenderness is noted for passive and active range of motion with painful descent of the arm strength is normal stability tests were normal neurovascular exam is normal and skin and axillary  areas were normal    Left shoulder impingement sign is negative skin overlying the left shoulder is normal. Lymph nodes in the axilla are normal. Pulses are good. Sensation is normal. Full range of motion, stability and strength are noted.  Data Reviewed X-rays on my interpretation done in the office are normal  Assessment    Rotator cuff syndrome    Plan    I've recommended a course of Naprosyn, physical therapy and continue muscle relaxer. No follow-up needed no rotator cuff tear or instability identified

## 2015-09-07 NOTE — Patient Instructions (Addendum)
Call [facility] therapy dept located in [place] to schedule therapy visits   Two meds sent to your pharmacy

## 2015-09-08 NOTE — Addendum Note (Signed)
Addended by: Adella Hare B on: 09/08/2015 11:15 AM   Modules accepted: Orders

## 2015-09-18 ENCOUNTER — Ambulatory Visit: Payer: PRIVATE HEALTH INSURANCE | Attending: Orthopedic Surgery | Admitting: Physical Therapy

## 2015-09-18 DIAGNOSIS — M25511 Pain in right shoulder: Secondary | ICD-10-CM

## 2015-09-18 DIAGNOSIS — R29898 Other symptoms and signs involving the musculoskeletal system: Secondary | ICD-10-CM | POA: Diagnosis present

## 2015-09-18 NOTE — Therapy (Signed)
[facility] [address] [place], [place], [address] Phone: [phone]   Fax:  [phone]  Physical Therapy Evaluation  Patient Details  Name: Teresa Peterson MRN: [id number] Date of Birth: 1986-12-23 Referring Provider:  Vickki Hearing, MD  Encounter Date: 09/18/2015      PT End of Session - 09/18/15 1016    Visit Number 1   Number of Visits 18   Date for PT Re-Evaluation 10/29/16   PT Start Time 0948   PT Stop Time 1033   PT Time Calculation (min) 45 min   Activity Tolerance Patient tolerated treatment well   Behavior During Therapy [facility] for tasks assessed/performed      Past Medical History  Diagnosis Date  . Asthma   . Emphysema of lung   . Heart murmur   . Headache(784.0)     Migraines    No past surgical history on file.  There were no vitals filed for this visit.  Visit Diagnosis:  Right shoulder pain - Plan: PT plan of care cert/re-cert  Shoulder weakness - Plan: PT plan of care cert/re-cert      Subjective Assessment - 09/18/15 0953    Subjective Right shoulder is sore today.   Patient Stated Goals Decrease right shoulder pain so I can workout.            [facility] PT Assessment - 09/18/15 0001    Assessment   Medical Diagnosis Right rotator cuff syndrome.   Onset Date/Surgical Date --  Ongoing.   Precautions   Precautions None   Restrictions   Weight Bearing Restrictions No   Balance Screen   Has the patient fallen in the past 6 months No   Has the patient had a decrease in activity level because of a fear of falling?  No   Is the patient reluctant to leave their home because of a fear of falling?  No   Home Environment   Living Environment Private residence   Prior Function   Level of Independence Independent   Posture/Postural Control   Posture/Postural Control Postural limitations   Postural Limitations Rounded Shoulders;Forward head;Decreased thoracic kyphosis   ROM / Strength    AROM / PROM / Strength AROM;Strength   AROM   Overall AROM Comments Full AROM of right shoulder.   Strength   Overall Strength Comments Left shoulder ER= 4-/5.   Palpation   Palpation comment TP over right UT with referred pain reports from shoulder to middle deltoid region.  Tender to palpation over left bicipital groove.                   [facility] Adult PT Treatment/Exercise - 09/18/15 0001    Modalities   Modalities Electrical Stimulation   Electrical Stimulation   Electrical Stimulation Location Left anterior shoulder.   Electrical Stimulation Action Pre-Mod 80-150 HZ constant x 15 minutes,   Electrical Stimulation Goals Pain                            Plan - 09/18/15 0953    Clinical Impression Statement Right shoulder pain started about 6 years ago.  Got worse after falling at work a year ago.  Went to ED some time ago due to severe right shoulder pain.  Patient wakes up with pain.  Pain lately 6/10 with behind the back and abduction.   Pt will benefit from skilled therapeutic intervention in order to improve on the  following deficits Pain;Decreased activity tolerance;Decreased strength   Rehab Potential Excellent   PT Frequency 3x / week   PT Duration 6 weeks   PT Treatment/Interventions ADLs/Self Care Home Management;Electrical Stimulation;Iontophoresis /ml Dexamethasone;Moist Heat;Therapeutic exercise;Ultrasound;Manual techniques;Patient/family education   PT Next Visit Plan STW/M to right anterior shoulder and right UT region; Combo E'stim/U/S; Ionto: e'stim;  RW4; SDLY ER; Full can; bent rows.         Problem List Patient Active Problem List   Diagnosis Date Noted  . Chest pain at rest 11/29/2011  . SOB (shortness of breath) 11/29/2011  . Chest pain 11/29/2011  . Asthma exacerbation 11/08/2011  . Tinea versicolor 11/08/2011  . Cough 11/08/2011    APPLE[place]ATE, [place] MPT 09/18/2015, 10:35 AM  [facility] [address] [place], [place], [address] Phone: [phone]   Fax:  [phone]

## 2015-09-22 ENCOUNTER — Ambulatory Visit: Payer: PRIVATE HEALTH INSURANCE | Admitting: Physical Therapy

## 2015-09-22 DIAGNOSIS — M25511 Pain in right shoulder: Secondary | ICD-10-CM

## 2015-09-22 DIAGNOSIS — R29898 Other symptoms and signs involving the musculoskeletal system: Secondary | ICD-10-CM

## 2015-09-22 NOTE — Therapy (Signed)
[facility] [address] [place], [place], [address] Phone: [phone]   Fax:  [phone]  Physical Therapy Treatment  Patient Details  Name: Teresa Peterson MRN: [id number] Date of Birth: 02/23/1986 Referring Provider:  Etta Grandchild, MD  Encounter Date: 09/22/2015      PT End of Session - 09/22/15 1340    Visit Number 2   Number of Visits 18   Date for PT Re-Evaluation 10/29/16   PT Start Time 1041   PT Stop Time 1127   PT Time Calculation (min) 46 min   Activity Tolerance Patient tolerated treatment well   Behavior During Therapy [facility] for tasks assessed/performed      Past Medical History  Diagnosis Date  . Asthma   . Emphysema of lung   . Heart murmur   . Headache(784.0)     Migraines    No past surgical history on file.  There were no vitals filed for this visit.  Visit Diagnosis:  Right shoulder pain  Shoulder weakness      Subjective Assessment - 09/22/15 1338    Subjective No new complaints.   Patient Stated Goals Decrease right shoulder pain so I can workout.   Pain Score 4    Pain Orientation Right   Pain Descriptors / Indicators Aching;Throbbing   Pain Onset 1 to 4 weeks ago      Treatment:  UBE x 6 minutes @ 120 RPM's; Constant Pre-mod E'stim x 18 minutes to patient's left affected anterior left shoulder region f/b STW/M including IASTM x 17 minutes.  Patient tolerated well and stated that treatment felt good.                                       Problem List Patient Active Problem List   Diagnosis Date Noted  . Chest pain at rest 11/29/2011  . SOB (shortness of breath) 11/29/2011  . Chest pain 11/29/2011  . Asthma exacerbation 11/08/2011  . Tinea versicolor 11/08/2011  . Cough 11/08/2011    APPLEGATE, [place] MPT 09/22/2015, 1:44 PM  [facility] [address] [place], [place], [address] Phone:  [phone]   Fax:  [phone]

## 2015-09-29 ENCOUNTER — Ambulatory Visit: Payer: PRIVATE HEALTH INSURANCE | Attending: Orthopedic Surgery | Admitting: Physical Therapy

## 2015-09-29 ENCOUNTER — Encounter: Payer: Self-pay | Admitting: Physical Therapy

## 2015-09-29 DIAGNOSIS — M25511 Pain in right shoulder: Secondary | ICD-10-CM | POA: Diagnosis present

## 2015-09-29 DIAGNOSIS — R29898 Other symptoms and signs involving the musculoskeletal system: Secondary | ICD-10-CM | POA: Insufficient documentation

## 2015-09-29 NOTE — Therapy (Signed)
[facility] [address] [place], [place], [address] Phone: [phone]   Fax:  [phone]  Physical Therapy Treatment  Patient Details  Name: Teresa Peterson MRN: [id number] Date of Birth: [birth year] Referring Provider:  Etta Grandchild, MD  Encounter Date: 09/29/2015      PT End of Session - 09/29/15 0911    Visit Number 3   Number of Visits 18   Date for PT Re-Evaluation 10/29/16   PT Start Time 0906   PT Stop Time 0946   PT Time Calculation (min) 40 min   Activity Tolerance Patient tolerated treatment well   Behavior During Therapy [facility] for tasks assessed/performed      Past Medical History  Diagnosis Date  . Asthma   . Emphysema of lung (HCC)   . Heart murmur   . Headache(784.0)     Migraines    No past surgical history on file.  There were no vitals filed for this visit.  Visit Diagnosis:  Right shoulder pain  Shoulder weakness      Subjective Assessment - 09/29/15 0910    Subjective Reports some shoulder pain but relates it to wet, damp weather. Reports that she has pain at rest sometimes.   Patient Stated Goals Decrease right shoulder pain so I can workout.   Currently in Pain? Yes   Pain Score 1    Pain Location Shoulder   Pain Orientation Right   Pain Descriptors / Indicators Aching   Pain Onset 1 to 4 weeks ago            [facility] PT Assessment - 09/29/15 0001    Assessment   Medical Diagnosis Right rotator cuff syndrome.                     [facility] Adult PT Treatment/Exercise - 09/29/15 0001    Exercises   Exercises Shoulder   Shoulder Exercises: Prone   Retraction Strengthening;Right;20 reps;Weights  Bent rows   Retraction Weight (lbs) 2   External Rotation Strengthening;Right;20 reps;Weights   External Rotation Weight (lbs) 1   Shoulder Exercises: Standing   External Rotation Strengthening;Right;20 reps;Theraband  Experienced 4/10 pain   Theraband Level (Shoulder  External Rotation) Level 1 (Yellow)   Internal Rotation Strengthening;Right;20 reps;Theraband   Theraband Level (Shoulder Internal Rotation) Level 1 (Yellow)   Row Strengthening;Right;20 reps;Theraband   Theraband Level (Shoulder Row) Level 1 (Yellow)   Other Standing Exercises UE ranger flex/circles x20 reps each   Shoulder Exercises: ROM/Strengthening   UBE (Upper Arm Bike) 120 RPM x46min   Modalities   Modalities Electrical Stimulation   Electrical Stimulation   Electrical Stimulation Location Left anterior shoulder.   Electrical Stimulation Action Pre-Mod   Electrical Stimulation Parameters 80-150 Hz x15 min   Electrical Stimulation Goals Pain                            Plan - 09/29/15 0944    Clinical Impression Statement Patient did well during today's treatment of introducing gentle strengthening of R shoulder. All exercises were directed with moderate multimodal cueing for proper form and technique. Patient reported begining to have pain with UE ranger and experienced 4/10 with theraband ER. Pain was not verbalized during any other exericses. Patient reported sidelyng R shoulder ER beng easier for her. Normal modaliites response noted following removal of the modaliities. Experienced numbness in R shoulder following removal of the stimulation.   Pt will  benefit from skilled therapeutic intervention in order to improve on the following deficits Pain;Decreased activity tolerance;Decreased strength   Rehab Potential Excellent   PT Frequency 3x / week   PT Duration 6 weeks   PT Treatment/Interventions ADLs/Self Care Home Management;Electrical Stimulation;Iontophoresis /ml Dexamethasone;Moist Heat;Therapeutic exercise;Ultrasound;Manual techniques;Patient/family education   PT Next Visit Plan Continue R shoulder strengthening per MPT POC. Continue modalities as needed.   Consulted and Agree with Plan of Care Patient        Problem List Patient Active Problem  List   Diagnosis Date Noted  . Chest pain at rest 11/29/2011  . SOB (shortness of breath) 11/29/2011  . Chest pain 11/29/2011  . Asthma exacerbation 11/08/2011  . Tinea versicolor 11/08/2011  . Cough 11/08/2011    Florence Canner, PTA 09/29/2015 9:55 AM  [facility] [address] [place], [place], [address] Phone: [phone]   Fax:  [phone]

## 2015-10-05 ENCOUNTER — Ambulatory Visit: Payer: PRIVATE HEALTH INSURANCE | Admitting: Physical Therapy

## 2015-10-05 DIAGNOSIS — M25511 Pain in right shoulder: Secondary | ICD-10-CM

## 2015-10-05 DIAGNOSIS — R29898 Other symptoms and signs involving the musculoskeletal system: Secondary | ICD-10-CM

## 2015-10-05 NOTE — Therapy (Signed)
[facility] [address] [place], [place], [address] Phone: [phone]   Fax:  [phone]  Physical Therapy Treatment  Patient Details  Name: Teresa Peterson MRN: [id number] Date of Birth: August 27, 1986 Referring Provider:  Etta GrandchildJones, Thomas L, MD  Encounter Date: 10/05/2015      PT End of Session - 10/05/15 1628    Visit Number 4   Number of Visits 18   Date for PT Re-Evaluation 10/29/16   PT Start Time 0315   PT Stop Time 0407   PT Time Calculation (min) 52 min   Activity Tolerance Patient tolerated treatment well   Behavior [facility] Therapy [facility] for tasks assessed/performed      Past Medical History  Diagnosis Date  . Asthma   . Emphysema of lung (HCC)   . Heart murmur   . Headache(784.0)     Migraines    No past surgical history on file.  There were no vitals filed for this visit.  Visit Diagnosis:  Right shoulder pain  Shoulder weakness      Subjective Assessment - 10/05/15 1630    Subjective My shoulder is doing very well today.   Patient Stated Goals Decrease right shoulder pain so I can workout.   Currently in Pain? Yes   Pain Score 1    Pain Location Shoulder   Pain Orientation Right   Pain Descriptors / Indicators Aching   Pain Onset 1 to 4 weeks ago   Aggravating Factors  Movement.   Pain Relieving Factors Rest.                         [facility] Adult PT Treatment/Exercise - 10/05/15 1632    Modalities   Modalities Ultrasound   Electrical Stimulation   Electrical Stimulation Location Left anterior shoulder.   Electrical Stimulation Action Constant pre-mod E' stim x 20 minutes.   Electrical Stimulation Parameters 80-150 HZ.   Electrical Stimulation Goals Pain   Ultrasound   Ultrasound Location Right anterior shoulder   Ultrasound Parameters 1.50 W/CM2 x 10 minutes.   Ultrasound Goals Pain   Manual Therapy   Manual therapy comments IASTM x 5 minutes.                      PT Long Term Goals - 09/29/15 1304    PT LONG TERM GOAL #1   Title Ind with HEP.   Time 6   Period Weeks   Status New   PT LONG TERM GOAL #2   Title Perform ADL's with pain not > 2-3/10.   Time 6   Period Weeks   Status New   PT LONG TERM GOAL #3   Title Right shoulder ER strength= 5/5.   Time 6   Period Weeks   Status New               Problem List Patient Active Problem List   Diagnosis Date Noted  . Chest pain at rest 11/29/2011  . SOB (shortness of breath) 11/29/2011  . Chest pain 11/29/2011  . Asthma exacerbation 11/08/2011  . Tinea versicolor 11/08/2011  . Cough 11/08/2011    Jeoffrey Eleazer, [place] MPT 10/05/2015, 4:39 PM  [facility] [address] [place], [place], [address] Phone: [phone]   Fax:  [phone]

## 2015-10-12 ENCOUNTER — Ambulatory Visit: Payer: PRIVATE HEALTH INSURANCE | Admitting: Physical Therapy

## 2015-10-12 ENCOUNTER — Encounter: Payer: Self-pay | Admitting: Physical Therapy

## 2015-10-12 DIAGNOSIS — M25511 Pain in right shoulder: Secondary | ICD-10-CM | POA: Diagnosis not present

## 2015-10-12 DIAGNOSIS — R29898 Other symptoms and signs involving the musculoskeletal system: Secondary | ICD-10-CM

## 2015-10-12 NOTE — Therapy (Signed)
[facility] [address] [place], [place], [address] Phone: [phone]   Fax:  [phone]  Physical Therapy Treatment  Patient Details  Name: Teresa Peterson MRN: [id number] Date of Birth: September 18, 1986 Referring Provider: Dr. Fuller Canada  Encounter Date: 10/12/2015      PT End of Session - 10/12/15 1604    Visit Number 5   Number of Visits 18   Date for PT Re-Evaluation 10/29/16   PT Start Time 1558   PT Stop Time 1636   PT Time Calculation (min) 38 min   Activity Tolerance Patient tolerated treatment well   Behavior During Therapy [facility] for tasks assessed/performed      Past Medical History  Diagnosis Date  . Asthma   . Emphysema of lung (HCC)   . Heart murmur   . Headache(784.0)     Migraines    No past surgical history on file.  There were no vitals filed for this visit.  Visit Diagnosis:  Right shoulder pain  Shoulder weakness      Subjective Assessment - 10/12/15 1604    Subjective States that her R shoulder has been hurting since Sunday. The air conditioning, heat from dishwasher at work, and neice sleeping on R shoulder flared shoulder up.   Patient Stated Goals Decrease right shoulder pain so I can workout.   Currently in Pain? Yes   Pain Score 3    Pain Location Shoulder   Pain Orientation Right   Pain Descriptors / Indicators Stabbing   Pain Onset 1 to 4 weeks ago            [facility] PT Assessment - 10/12/15 0001    Assessment   Medical Diagnosis Right rotator cuff syndrome.   Referring Provider Dr. Fuller Canada                     [facility] Adult PT Treatment/Exercise - 10/12/15 0001    Shoulder Exercises: Prone   Retraction Strengthening;Right;20 reps;Weights   Retraction Weight (lbs) 2   Extension Strengthening;Right;20 reps;Weights   Extension Weight (lbs) 2   Shoulder Exercises: Sidelying   External Rotation Strengthening;Right;20 reps;Weights   External Rotation  Weight (lbs) 1   Shoulder Exercises: Standing   External Rotation Strengthening;Right;20 reps;Theraband   Theraband Level (Shoulder External Rotation) Level 1 (Yellow)   Internal Rotation Strengthening;Right;20 reps;Theraband   Theraband Level (Shoulder Internal Rotation) Level 1 (Yellow)   Extension Strengthening;Right;20 reps;Theraband   Theraband Level (Shoulder Extension) Level 1 (Yellow)   Row Strengthening;Right;20 reps;Theraband   Theraband Level (Shoulder Row) Level 1 (Yellow)   Shoulder Exercises: ROM/Strengthening   UBE (Upper Arm Bike) 90 RPM x6 min   Modalities   Modalities Electrical Stimulation   Electrical Stimulation   Electrical Stimulation Location Left shoulder.   Electrical Stimulation Action Pre-Mod   Electrical Stimulation Parameters 80-150 Hz x15 min   Electrical Stimulation Goals Pain                     PT Long Term Goals - 10/12/15 1626    PT LONG TERM GOAL #1   Title Ind with HEP.   Time 6   Period Weeks   Status On-going   PT LONG TERM GOAL #2   Title Perform ADL's with pain not > 2-3/10.   Time 6   Period Weeks   Status On-going   PT LONG TERM GOAL #3   Title Right shoulder ER strength= 5/5.   Time 6  Period Weeks   Status On-going               Plan - 10/12/15 1622    Clinical Impression Statement Patient tolerated treatment fairly well today and did not verbalize increased R shoulder pain with any of the exercises directed today. Requires minimal multimodal cueing for proper exercise technique. Goals from evaluation remain on-going at this time secondary to pain experienced by patient and strength deficits. Denied R shoulder pain following today's treatment.   Pt will benefit from skilled therapeutic intervention in order to improve on the following deficits Pain;Decreased activity tolerance;Decreased strength   Rehab Potential Excellent   PT Frequency 3x / week   PT Duration 6 weeks   PT Treatment/Interventions  ADLs/Self Care Home Management;Electrical Stimulation;Iontophoresis 4mg /ml Dexamethasone;Moist Heat;Therapeutic exercise;Ultrasound;Manual techniques;Patient/family education   PT Next Visit Plan Continue R shoulder strengthening per MPT POC. Continue modalities as needed.   Consulted and Agree with Plan of Care Patient        Problem List Patient Active Problem List   Diagnosis Date Noted  . Chest pain at rest 11/29/2011  . SOB (shortness of breath) 11/29/2011  . Chest pain 11/29/2011  . Asthma exacerbation 11/08/2011  . Tinea versicolor 11/08/2011  . Cough 11/08/2011    Evelene CroonKelsey M Parsons, PTA 10/12/2015, 4:38 PM  [facility] [address] [place], [place], [address] Phone: [phone]   Fax:  [phone]  Name: Teresa Peterson MRN: [id number] Date of Birth: [birth year]

## 2015-10-18 ENCOUNTER — Encounter: Payer: Self-pay | Admitting: Physical Therapy

## 2015-10-18 ENCOUNTER — Ambulatory Visit: Payer: PRIVATE HEALTH INSURANCE | Admitting: Physical Therapy

## 2015-10-18 DIAGNOSIS — M25511 Pain in right shoulder: Secondary | ICD-10-CM

## 2015-10-18 DIAGNOSIS — R29898 Other symptoms and signs involving the musculoskeletal system: Secondary | ICD-10-CM

## 2015-10-18 NOTE — Patient Instructions (Signed)
  Strengthening: Resisted Flexion   Hold tubing with left arm at side. Pull forward and up. Move shoulder through pain-free range of motion. Repeat __10__ times per set. Do _2-3___ sets per session. Do _2-3___ sessions per day. http://orth.exer.us/824   Copyright  VHI. All rights reserved.  Strengthening: Resisted Extension   Hold tubing in right hand, arm forward. Pull arm back, elbow straight. Repeat __10__ times per set. Do _2-3___ sets per session. Do _2-3___ sessions per day.  http://orth.exer.us/832   Copyright  VHI. All rights reserved.  Strengthening: Resisted Internal Rotation   Hold tubing in left hand, elbow at side and forearm out. Rotate forearm in across body. Repeat __10__ times per set. Do _2-3___ sets per session. Do _2-3___ sessions per day.  http://orth.exer.us/830   Copyright  VHI. All rights reserved.  Strengthening: Resisted External Rotation   Hold tubing in right hand, elbow at side and forearm across body. Rotate forearm out. Repeat __10__ times per set. Do __2-3__ sets per session. Do ____ sessions per day.

## 2015-10-18 NOTE — Therapy (Signed)
[facility] [place], [place], [address] Phone: [phone]   Fax:  [phone]  Physical Therapy Treatment  Patient Details  Name: SYMIA HERDT MRN: [id number] Date of Birth: 06/21/1986 Referring Provider: Dr. Arther Abbott  Encounter Date: 10/18/2015      PT End of Session - 10/18/15 1302    Visit Number 6   Number of Visits 18   Date for PT Re-Evaluation 10/29/16   PT Start Time 1229   PT Stop Time 1319   PT Time Calculation (min) 50 min   Activity Tolerance Patient tolerated treatment well   Behavior During Therapy [facility] for tasks assessed/performed      Past Medical History  Diagnosis Date  . Asthma   . Emphysema of lung ([place])   . Heart murmur   . Headache(784.0)     Migraines    History reviewed. No pertinent past surgical history.  There were no vitals filed for this visit.  Visit Diagnosis:  Right shoulder pain  Shoulder weakness      Subjective Assessment - 10/18/15 1233    Subjective No pain reported today. Patient said right shoulder is better today   Patient Stated Goals Decrease right shoulder pain so I can workout.   Currently in Pain? No/denies                         [facility] Adult PT Treatment/Exercise - 10/18/15 0001    Shoulder Exercises: Prone   Retraction Strengthening;Right;Weights  kneeling 3x10   Retraction Weight (lbs) 2   Extension Strengthening;Right;Weights  kneeling 3x10   Extension Weight (lbs) 2   Shoulder Exercises: Sidelying   External Rotation Strengthening;Right;Weights  3x10   External Rotation Weight (lbs) 2   Shoulder Exercises: Standing   External Rotation Strengthening;Right;Theraband  3x10   Theraband Level (Shoulder External Rotation) Level 1 (Yellow)   Internal Rotation Strengthening;Right;Theraband  3x10   Theraband Level (Shoulder Internal Rotation) Level 1 (Yellow)   Flexion Strengthening;Right;Weights  3x10   Shoulder  Flexion Weight (lbs) 2   Extension Strengthening;Right;Theraband  3x10   Theraband Level (Shoulder Extension) Level 1 (Yellow)   Row Strengthening;Right;Theraband  3x10   Theraband Level (Shoulder Row) Level 1 (Yellow)   Shoulder Exercises: ROM/Strengthening   UBE (Upper Arm Bike) 90 RPM x6 min   Electrical Stimulation   Electrical Stimulation Location left shoulder   Electrical Stimulation Action premod   Electrical Stimulation Parameters 80-'150HZ'    Electrical Stimulation Goals Pain                PT Education - 10/18/15 1255    Education provided Yes   Education Details HEP   Person(s) Educated Patient   Methods Explanation;Demonstration;Handout   Comprehension Verbalized understanding;Returned demonstration             PT Long Term Goals - 10/18/15 1304    PT LONG TERM GOAL #1   Title Ind with HEP.   Time 6   Period Weeks   Status Achieved   PT LONG TERM GOAL #2   Title Perform ADL's with pain not > 2-3/10.   Time 6   Period Weeks   Status On-going   PT LONG TERM GOAL #3   Title Right shoulder ER strength= 5/5.   Time 6   Period Weeks   Status On-going               Plan - 10/18/15 1304    Clinical  Impression Statement Patient reported improvement overall and had no pain today, only fatigue and sore after treatment and requested ES to help relax muscles. Patient was given HEP for 4 way shoulder exercises and yellow t-band. Patient met LTG#1 others ongoing due to pian and strength limitations.    Pt will benefit from skilled therapeutic intervention in order to improve on the following deficits Pain;Decreased activity tolerance;Decreased strength   Rehab Potential Excellent   PT Frequency 3x / week   PT Duration 6 weeks   PT Treatment/Interventions ADLs/Self Care Home Management;Electrical Stimulation;Iontophoresis 44m/ml Dexamethasone;Moist Heat;Therapeutic exercise;Ultrasound;Manual techniques;Patient/family education   PT Next Visit Plan  Continue R shoulder strengthening per MPT POC. Continue modalities/ionto as needed.   Consulted and Agree with Plan of Care Patient        Problem List Patient Active Problem List   Diagnosis Date Noted  . Chest pain at rest 11/29/2011  . SOB (shortness of breath) 11/29/2011  . Chest pain 11/29/2011  . Asthma exacerbation 11/08/2011  . Tinea versicolor 11/08/2011  . Cough 11/08/2011    Kenyatte Chatmon P, PTA 10/18/2015, 1:22 PM  [facility] [address]Phone: [phone]  Fax:  [phone] Name: JVADA SWIFTMRN: [id number] of Birth: [birth year]

## 2016-01-10 NOTE — Therapy (Signed)
[facility] [place], [place], [address] Phone: [phone]   Fax:  [phone]  Physical Therapy Treatment  Patient Details  Name: Teresa Peterson MRN: [id number] Date of Birth: Jan 26, 1986 Referring Provider: Dr. Arther Abbott  Encounter Date: 10/18/2015    Past Medical History  Diagnosis Date  . Asthma   . Emphysema of lung ([place])   . Heart murmur   . Headache(784.0)     Migraines    History reviewed. No pertinent past surgical history.  There were no vitals filed for this visit.  Visit Diagnosis:  Right shoulder pain  Shoulder weakness                                    PT Long Term Goals - 10/18/15 1304    PT LONG TERM GOAL #1   Title Ind with HEP.   Time 6   Period Weeks   Status Achieved   PT LONG TERM GOAL #2   Title Perform ADL's with pain not > 2-3/10.   Time 6   Period Weeks   Status On-going   PT LONG TERM GOAL #3   Title Right shoulder ER strength= 5/5.   Time 6   Period Weeks   Status On-going               Problem List Patient Active Problem List   Diagnosis Date Noted  . Chest pain at rest 11/29/2011  . SOB (shortness of breath) 11/29/2011  . Chest pain 11/29/2011  . Asthma exacerbation 11/08/2011  . Tinea versicolor 11/08/2011  . Cough 11/08/2011   PHYSICAL THERAPY DISCHARGE SUMMARY  Visits from Start of Care: 6  Current functional level related to goals / functional outcomes: Please see above.   Remaining deficits: Continued right shoulder pain.   Education / Equipment: HEP. Plan: Patient agrees to discharge.  Patient goals were not met. Patient is being discharged due to not returning since the last visit.  ?????       Trevin Gartrell, [place] MPT 01/10/2016, 2:01 PM  [facility] [address] [place], [place], [address] Phone: [phone]   Fax:  [phone]  Name: CLARIVEL CALLAWAY MRN: [id number] Date of Birth: [birth year]

## 2017-11-16 ENCOUNTER — Emergency Department (HOSPITAL_BASED_OUTPATIENT_CLINIC_OR_DEPARTMENT_OTHER)
Admission: EM | Admit: 2017-11-16 | Discharge: 2017-11-17 | Disposition: A | Discharge status: Home / Self Care | Payer: PRIVATE HEALTH INSURANCE | Attending: Emergency Medicine | Admitting: Emergency Medicine

## 2017-11-16 ENCOUNTER — Other Ambulatory Visit: Payer: Self-pay

## 2017-11-16 ENCOUNTER — Encounter (HOSPITAL_BASED_OUTPATIENT_CLINIC_OR_DEPARTMENT_OTHER): Payer: Self-pay | Admitting: *Deleted

## 2017-11-16 DIAGNOSIS — N760 Acute vaginitis: Secondary | ICD-10-CM | POA: Insufficient documentation

## 2017-11-16 DIAGNOSIS — J45909 Unspecified asthma, uncomplicated: Secondary | ICD-10-CM | POA: Insufficient documentation

## 2017-11-16 DIAGNOSIS — R112 Nausea with vomiting, unspecified: Secondary | ICD-10-CM

## 2017-11-16 DIAGNOSIS — B9689 Other specified bacterial agents as the cause of diseases classified elsewhere: Secondary | ICD-10-CM

## 2017-11-16 DIAGNOSIS — F17210 Nicotine dependence, cigarettes, uncomplicated: Secondary | ICD-10-CM | POA: Insufficient documentation

## 2017-11-16 LAB — COMPREHENSIVE METABOLIC PANEL
ALK PHOS: 43 U/L (ref 38–126)
ALT: 17 U/L (ref 14–54)
ANION GAP: 5 (ref 5–15)
AST: 25 U/L (ref 15–41)
Albumin: 3.6 g/dL (ref 3.5–5.0)
BILIRUBIN TOTAL: 0.4 mg/dL (ref 0.3–1.2)
BUN: 14 mg/dL (ref 6–20)
CALCIUM: 8.5 mg/dL — AB (ref 8.9–10.3)
CO2: 23 mmol/L (ref 22–32)
Chloride: 110 mmol/L (ref 101–111)
Creatinine, Ser: 0.75 mg/dL (ref 0.44–1.00)
GLUCOSE: 96 mg/dL (ref 65–99)
POTASSIUM: 3.8 mmol/L (ref 3.5–5.1)
Sodium: 138 mmol/L (ref 135–145)
TOTAL PROTEIN: 6.6 g/dL (ref 6.5–8.1)

## 2017-11-16 LAB — CBC WITH DIFFERENTIAL/PLATELET
BASOS ABS: 0.1 10*3/uL (ref 0.0–0.1)
BASOS PCT: 1 %
EOS ABS: 0.8 10*3/uL — AB (ref 0.0–0.7)
Eosinophils Relative: 8 %
HCT: 37.7 % (ref 36.0–46.0)
HEMOGLOBIN: 11.8 g/dL — AB (ref 12.0–15.0)
Lymphocytes Relative: 29 %
Lymphs Abs: 2.9 10*3/uL (ref 0.7–4.0)
MCH: 28.6 pg (ref 26.0–34.0)
MCHC: 31.3 g/dL (ref 30.0–36.0)
MCV: 91.3 fL (ref 78.0–100.0)
MONOS PCT: 8 %
Monocytes Absolute: 0.8 10*3/uL (ref 0.1–1.0)
NEUTROS PCT: 54 %
Neutro Abs: 5.4 10*3/uL (ref 1.7–7.7)
Platelets: 288 10*3/uL (ref 150–400)
RBC: 4.13 MIL/uL (ref 3.87–5.11)
RDW: 13.7 % (ref 11.5–15.5)
WBC: 10 10*3/uL (ref 4.0–10.5)

## 2017-11-16 LAB — WET PREP, GENITAL
SPERM: NONE SEEN
TRICH WET PREP: NONE SEEN
YEAST WET PREP: NONE SEEN

## 2017-11-16 LAB — URINALYSIS, ROUTINE W REFLEX MICROSCOPIC
BILIRUBIN URINE: NEGATIVE
Glucose, UA: NEGATIVE mg/dL
Hgb urine dipstick: NEGATIVE
KETONES UR: NEGATIVE mg/dL
LEUKOCYTES UA: NEGATIVE
NITRITE: NEGATIVE
Protein, ur: NEGATIVE mg/dL
SPECIFIC GRAVITY, URINE: 1.025 (ref 1.005–1.030)
pH: 6 (ref 5.0–8.0)

## 2017-11-16 LAB — LIPASE, BLOOD: LIPASE: 28 U/L (ref 11–51)

## 2017-11-16 LAB — PREGNANCY, URINE: PREG TEST UR: NEGATIVE

## 2017-11-16 MED ORDER — METRONIDAZOLE 500 MG PO TABS: 500.0000 mg | ORAL_TABLET | Freq: Two times a day (BID) | ORAL | 0 refills | Status: DC

## 2017-11-16 MED ORDER — ONDANSETRON HCL 4 MG/2ML IJ SOLN
INTRAMUSCULAR | Status: AC
  Filled 2017-11-16: qty 2

## 2017-11-16 MED ORDER — ONDANSETRON HCL 4 MG/2ML IJ SOLN
4.0000 mg | Freq: Once | INTRAMUSCULAR | Status: AC
  Administered 2017-11-16: 4 mg via INTRAVENOUS

## 2017-11-16 MED ORDER — SODIUM CHLORIDE 0.9 % IV BOLUS (SEPSIS)
1000.0000 mL | Freq: Once | INTRAVENOUS | Status: AC
  Administered 2017-11-16: 1000 mL via INTRAVENOUS

## 2017-11-16 MED ORDER — ONDANSETRON 4 MG PO TBDP
4.0000 mg | ORAL_TABLET | Freq: Once | ORAL | Status: AC | PRN
  Administered 2017-11-16: 4 mg via ORAL
  Filled 2017-11-16: qty 1

## 2017-11-16 NOTE — ED Triage Notes (Signed)
Pt reports low back pain and nausea x 1. Reports increase in vaginal discharge

## 2017-11-16 NOTE — ED Provider Notes (Signed)
Emergency Department Provider Note   I have reviewed the triage vital signs and the nursing notes.   HISTORY  Chief Complaint Nausea and Back Pain   HPI Teresa Peterson is a 31 y.o. female with PMH of asthma presents to the emergency department for evaluation of nausea and vomiting for the past 24 hours.  She has some associated lower back pain and diarrhea.  No blood in the emesis or stool.  She denies any fevers or chills.  No dyspnea.  Worsening vaginal discharge but denies pain.  No flank pain.  She is tried Tylenol and Motrin at home with little to no relief in her back discomfort.  No radiation of symptoms down the legs.  Pain worse with movement. No concern for STD. No modifying factors.   Past Medical History:  Diagnosis Date  . Asthma   . Emphysema of lung (HCC)   . Headache(784.0)    Migraines  . Heart murmur     Patient Active Problem List   Diagnosis Date Noted  . Chest pain at rest 11/29/2011  . SOB (shortness of breath) 11/29/2011  . Chest pain 11/29/2011  . Asthma exacerbation 11/08/2011  . Tinea versicolor 11/08/2011  . Cough 11/08/2011    History reviewed. No pertinent surgical history.  Current Outpatient Rx  . Order #: [id number] Class: Normal  . Order #: [id number] Class: Sample  . Order #: [id number] Class: Historical Med  . Order #: [id number] Class: Normal  . Order #: [id number] Class: Print  . Order #: [id number] Class: Historical Med  . Order #: [id number] Class: Print  . Order #: [id number] Class: Normal  . Order #: [id number] Class: Historical Med  . Order #: [id number] Class: Print  . Order #: [id number] Class: Normal  . Order #: [id number] Class: Historical Med  . Order #: [id number] Class: Historical Med    Allergies Sudafed [pseudoephedrine hcl]  Family History  Problem Relation Age of Onset  . Alcohol abuse Other        grandparents  . Arthritis Other        grandparent  . Cancer Other        relative  . Stroke Other        grandparent  . Kidney  disease Other        grandparents  . Diabetes Other        grandparent  . Asthma Brother     Social History Social History   Tobacco Use  . Smoking status: Current Every Day Smoker    Types: Cigarettes    Last attempt to quit: 07/08/2011    Years since quitting: 6.3  . Smokeless tobacco: Never Used  Substance Use Topics  . Alcohol use: Yes    Comment: drink 1 bottle wine/day  . Drug use: No    Comment: quit 06/2011    Review of Systems  Constitutional: No fever/chills Eyes: No visual changes. ENT: No sore throat. Cardiovascular: Denies chest pain. Respiratory: Denies shortness of breath. Gastrointestinal: No abdominal pain. Positive nausea and vomiting. Positive diarrhea.  No constipation. Genitourinary: Negative for dysuria. Positive vaginal discharge.  Musculoskeletal: Positive for back pain. Skin: Negative for rash. Neurological: Negative for headaches, focal weakness or numbness.  10-point ROS otherwise negative.  ____________________________________________   PHYSICAL EXAM:  VITAL SIGNS: ED Triage Vitals [11/16/17 2205]  Enc Vitals Group     BP (!) 167/77     Pulse Rate 96     Resp 16     Temp 98.1 F (36.7 C)     Temp Source  Oral     SpO2 100 %     Pain Score 6   Constitutional: Alert and oriented. Well appearing and in no acute distress. Eyes: Conjunctivae are normal.  Head: Atraumatic.  Nose: No congestion/rhinnorhea. Mouth/Throat: Mucous membranes are moist.  Oropharynx non-erythematous. Neck: No stridor.   Cardiovascular: Normal rate, regular rhythm. Good peripheral circulation. Grossly normal heart sounds.   Respiratory: Normal respiratory effort.  No retractions. Lungs CTAB. Gastrointestinal: Soft and nontender. No distention.  Genitourinary: Moderate white vaginal discharge. Normal external genitalia. No CMT or adnexal tenderness/fullness. No vaginal bleeding.  Musculoskeletal: No lower extremity tenderness nor edema. No gross deformities  of extremities. Neurologic:  Normal speech and language. No gross focal neurologic deficits are appreciated.  Skin:  Skin is warm, dry and intact. No rash noted.  ____________________________________________   LABS (all labs ordered are listed, but only abnormal results are displayed)  Labs Reviewed  WET PREP, GENITAL - Abnormal; Notable for the following components:      Result Value   Clue Cells Wet Prep HPF POC PRESENT (*)    WBC, Wet Prep HPF POC MODERATE (*)    All other components within normal limits  COMPREHENSIVE METABOLIC PANEL - Abnormal; Notable for the following components:   Calcium 8.5 (*)    All other components within normal limits  CBC WITH DIFFERENTIAL/PLATELET - Abnormal; Notable for the following components:   Hemoglobin 11.8 (*)    Eosinophils Absolute 0.8 (*)    All other components within normal limits  LIPASE, BLOOD  URINALYSIS, ROUTINE W REFLEX MICROSCOPIC  PREGNANCY, URINE  GC/CHLAMYDIA PROBE AMP ([facility]) NOT AT [facility]   ____________________________________________  RADIOLOGY  None ____________________________________________   PROCEDURES  Procedure(s) performed:   Procedures  None ____________________________________________   INITIAL IMPRESSION / ASSESSMENT AND PLAN / ED COURSE  Pertinent labs & imaging results that were available during my care of the patient were reviewed by me and considered in my medical decision making (see chart for details).  Patient presents to the ED with nausea, vomiting, and diarrhea over the last 24 hours. She has no abdominal pain or tenderness on exam. No concern for SBO or other acute surgical process in the abdomen. Vaginal discharge appears to be unrelated. No evidence of PID on exam. Following wet prep. No concern for STD exposure so will not treat for STDs empirically. Plan for labs, UA, IVF. Patient received ODT Zofran in triage. No indication for abdominal or pelvic imaging.   11:43 PM Patient  has now provided a urine sample. She had additional vomiting. IVF running. Will give IV Zofran, follow UA, and reassess. No evidence of PID or concern for TOA on pelvic exam. She does have some bacterial vaginosis on wet prep so will treat with Flagyl. Will need further nausea/vomiting mgmt and PO challenge prior to D/C.   Care transferred to Dr. Patria Mane who will follow patient's symptoms.  ____________________________________________  FINAL CLINICAL IMPRESSION(S) / ED DIAGNOSES  Final diagnoses:  Non-intractable vomiting with nausea, unspecified vomiting type  Bacterial vaginosis     MEDICATIONS GIVEN DURING THIS VISIT:  Medications  ondansetron (ZOFRAN) injection 4 mg (not administered)  ondansetron (ZOFRAN-ODT) disintegrating tablet 4 mg (4 mg Oral Given 11/16/17 2225)  sodium chloride 0.9 % bolus 1,000 mL (1,000 mLs Intravenous New Bag/Given 11/16/17 2300)    Note:  This document was prepared using Dragon voice recognition software and may include unintentional dictation errors.  Alona Bene, MD Emergency Medicine    Jeremy Ditullio, Arlyss Repress,  MD 11/17/17 1153

## 2017-11-16 NOTE — Discharge Instructions (Signed)
You have been seen in the Emergency Department (ED) today for nausea and vomiting.  Your work up today has not shown a clear cause for your symptoms.  You were also diagnosed with bacterial vaginosis. This is likely causing your vaginal discharge. Take the antibiotic Flagyl for the next week to treat this. DO NOT DRINK ALCOHOL WHILE TAKING THIS MEDICATION AS IT CAN CAUSE ABDOMINAL PAIN AND VOMITING.   Follow up with your doctor as soon as possible regarding today?s emergent visit and your symptoms of nausea.   Return to the ED if you develop abdominal, bloody vomiting, bloody diarrhea, if you are unable to tolerate fluids due to vomiting, or if you develop other symptoms that concern you.

## 2017-11-16 NOTE — ED Notes (Signed)
Pt attempted to obtain urine sample but was unable to at this time

## 2017-11-17 MED ORDER — ONDANSETRON 8 MG PO TBDP: 8.0000 mg | ORAL_TABLET | Freq: Three times a day (TID) | ORAL | 0 refills | Status: DC | PRN

## 2017-11-17 NOTE — ED Provider Notes (Signed)
Pt improved. Keeping down fluid. Dc home in good condition   Azalia Bilisampos, Rei Contee, MD 11/17/17 [phone]

## 2017-11-18 LAB — GC/CHLAMYDIA PROBE AMP (~~LOC~~) NOT AT ARMC
CHLAMYDIA, DNA PROBE: NEGATIVE
NEISSERIA GONORRHEA: NEGATIVE

## 2018-04-20 ENCOUNTER — Emergency Department (HOSPITAL_COMMUNITY)
Admission: EM | Admit: 2018-04-20 | Discharge: 2018-04-21 | Disposition: A | Discharge status: Home / Self Care | Payer: Medicaid - Out of State | Attending: Emergency Medicine | Admitting: Emergency Medicine

## 2018-04-20 ENCOUNTER — Emergency Department (HOSPITAL_COMMUNITY): Payer: Medicaid - Out of State

## 2018-04-20 ENCOUNTER — Other Ambulatory Visit: Payer: Self-pay

## 2018-04-20 ENCOUNTER — Encounter (HOSPITAL_COMMUNITY): Payer: Self-pay | Admitting: Emergency Medicine

## 2018-04-20 DIAGNOSIS — J189 Pneumonia, unspecified organism: Secondary | ICD-10-CM | POA: Diagnosis not present

## 2018-04-20 DIAGNOSIS — J45909 Unspecified asthma, uncomplicated: Secondary | ICD-10-CM | POA: Insufficient documentation

## 2018-04-20 DIAGNOSIS — F17210 Nicotine dependence, cigarettes, uncomplicated: Secondary | ICD-10-CM | POA: Insufficient documentation

## 2018-04-20 DIAGNOSIS — R05 Cough: Secondary | ICD-10-CM | POA: Diagnosis present

## 2018-04-20 DIAGNOSIS — J40 Bronchitis, not specified as acute or chronic: Secondary | ICD-10-CM

## 2018-04-20 DIAGNOSIS — J209 Acute bronchitis, unspecified: Secondary | ICD-10-CM | POA: Insufficient documentation

## 2018-04-20 DIAGNOSIS — R059 Cough, unspecified: Secondary | ICD-10-CM

## 2018-04-20 MED ORDER — HYDROCODONE-HOMATROPINE 5-1.5 MG/5ML PO SYRP
5.0000 mL | ORAL_SOLUTION | Freq: Once | ORAL | Status: AC
  Administered 2018-04-20: 5 mL via ORAL
  Filled 2018-04-20: qty 5

## 2018-04-20 MED ORDER — PREDNISONE 50 MG PO TABS
60.0000 mg | ORAL_TABLET | Freq: Once | ORAL | Status: AC
  Administered 2018-04-20: 60 mg via ORAL
  Filled 2018-04-20: qty 1

## 2018-04-20 MED ORDER — DOXYCYCLINE HYCLATE 100 MG PO TABS
100.0000 mg | ORAL_TABLET | Freq: Once | ORAL | Status: AC
  Administered 2018-04-20: 100 mg via ORAL
  Filled 2018-04-20: qty 1

## 2018-04-20 MED ORDER — IPRATROPIUM-ALBUTEROL 0.5-2.5 (3) MG/3ML IN SOLN
3.0000 mL | RESPIRATORY_TRACT | Status: AC
  Administered 2018-04-20 (×3): 3 mL via RESPIRATORY_TRACT
  Filled 2018-04-20: qty 9

## 2018-04-20 NOTE — ED Triage Notes (Signed)
Pt c/o coughing until she vomits and upper abd soreness.

## 2018-04-20 NOTE — ED Provider Notes (Signed)
Emergency Department Provider Note   I have reviewed the triage vital signs and the nursing notes.   HISTORY  Chief Complaint Cough   HPI Teresa Peterson is a 32 y.o. female smoker with a history of emphysema and asthma the presents the emergency department today secondary to cough, vomiting, shortness of breath and sore throat.  Patient states that this is been going on for Apt. 20 4 to 36 hours.  She will have some posttussive emesis but then she also has some vomiting without coughing.  Her throats become raw and it hurts to take a deep breath with the symptoms.  She has not tried anything for symptoms.  Her last cigarette was yesterday and she cannot smoke because of the coughing and the pain.  She is nervous to go this before.  She states she is tried her albuterol nebulizer at home without relief.  She also states she Symbicort but cannot afford it.  No lower extremity swelling.  No chest pain aside from coughing.  She does have upper abdominal pain.  No analysis been sick around her.  No recent travels. No other associated or modifying symptoms.    Past Medical History:  Diagnosis Date  . Asthma   . Emphysema of lung (HCC)   . Headache(784.0)    Migraines  . Heart murmur     Patient Active Problem List   Diagnosis Date Noted  . Chest pain at rest 11/29/2011  . SOB (shortness of breath) 11/29/2011  . Chest pain 11/29/2011  . Asthma exacerbation 11/08/2011  . Tinea versicolor 11/08/2011  . Cough 11/08/2011    History reviewed. No pertinent surgical history.  Current Outpatient Rx  . Order #: [id number] Class: Normal  . Order #: [id number] Class: Print  . Order #: [id number] Class: Print  . Order #: [id number] Class: Print  . Order #: [id number] Class: Print    Allergies Sudafed [pseudoephedrine hcl]  Family History  Problem Relation Age of Onset  . Alcohol abuse Other        grandparents  . Arthritis Other        grandparent  . Cancer Other        relative    . Stroke Other        grandparent  . Kidney disease Other        grandparents  . Diabetes Other        grandparent  . Asthma Brother     Social History Social History   Tobacco Use  . Smoking status: Current Every Day Smoker    Types: Cigarettes    Last attempt to quit: 07/08/2011    Years since quitting: 6.7  . Smokeless tobacco: Never Used  Substance Use Topics  . Alcohol use: Yes    Comment: drink 1 bottle wine/day  . Drug use: No    Comment: quit 06/2011    Review of Systems  All other systems negative except as documented in the HPI. All pertinent positives and negatives as reviewed in the HPI. ____________________________________________   PHYSICAL EXAM:  VITAL SIGNS: ED Triage Vitals [04/20/18 2018]  Enc Vitals Group     BP (!) 159/93     Pulse Rate 96     Resp 18     Temp 99.6 F (37.6 C)     Temp Source Oral     SpO2 97 %     Weight 183 lb (83 kg)     Height  (1.6 m)     Head  Circumference      Peak Flow      Pain Score 6     Pain Loc      Pain Edu?      Excl. in GC?     Constitutional: Alert and oriented. Well appearing and in no acute distress. Eyes: Conjunctivae are normal. PERRL. EOMI. Head: Atraumatic. Nose: No congestion/rhinnorhea. Mouth/Throat: Mucous membranes are moist.  Oropharynx non-erythematous. Neck: No stridor.  No meningeal signs.   Cardiovascular: Normal rate, regular rhythm. Good peripheral circulation. Grossly normal heart sounds.   Respiratory: Normal respiratory effort.  No retractions. Lungs significantly diminished with wheezing. Gastrointestinal: Soft and nontender. No distention.  Musculoskeletal: No lower extremity tenderness nor edema. No gross deformities of extremities. Neurologic:  Normal speech and language. No gross focal neurologic deficits are appreciated.  Skin:  Skin is warm, dry and intact. No rash noted. ____________________________________________   LABS (all labs ordered are listed, but only  abnormal results are displayed)  Labs Reviewed - No data to display ____________________________________________   RADIOLOGY  Dg Chest 2 View  Result Date: 04/20/2018 CLINICAL DATA:  Cough EXAM: CHEST - 2 VIEW COMPARISON:  11/08/2011 FINDINGS: Airspace disease in the lingula and right lung base. No pleural effusion. Normal heart size. No pneumothorax. IMPRESSION: Airspace disease at the lingula and right base suspicious for multifocal pneumonia. Electronically Signed   By: Sheliah Pang M.D.   On: 04/20/2018 21:06    ____________________________________________   PROCEDURES  Procedure(s) performed:   Procedures   ____________________________________________   INITIAL IMPRESSION / ASSESSMENT AND PLAN / ED COURSE  Exam consistent with likely bronchitis recommend acquired pneumonia chest x-ray does show multifocal opacities concerning for pneumonia.  Will treat for both.  Suspect she will likely be discharged.  Community acquired pneumonia. Thinks she vomited the prednisone up so given IM Dose instead. Significant improvement in symptoms after breathing treatments. Air movement improved. Will dc on abx/steroids/cough medicine.      Pertinent labs & imaging results that were available during my care of the patient were reviewed by me and considered in my medical decision making (see chart for details).  ____________________________________________  FINAL CLINICAL IMPRESSION(S) / ED DIAGNOSES  Final diagnoses:  Cough  Bronchitis  Community acquired pneumonia, unspecified laterality     MEDICATIONS GIVEN DURING THIS VISIT:  Medications  promethazine (PHENERGAN) injection 25 mg (has no administration in time range)  methylPREDNISolone sodium succinate (SOLU-MEDROL) 125 mg/2 mL injection 125 mg (has no administration in time range)  ipratropium-albuterol (DUONEB) 0.5-2.5 (3) MG/3ML nebulizer solution 3 mL (3 mLs Nebulization Given 04/20/18 2324)  doxycycline (VIBRA-TABS)  tablet 100 mg (100 mg Oral Given 04/20/18 2310)  predniSONE (DELTASONE) tablet 60 mg (60 mg Oral Given 04/20/18 2310)  HYDROcodone-homatropine (HYCODAN) 5-1.5 MG/5ML syrup 5 mL (5 mLs Oral Given 04/20/18 2310)     NEW OUTPATIENT MEDICATIONS STARTED DURING THIS VISIT:  New Prescriptions   DOXYCYCLINE (VIBRAMYCIN) 100 MG CAPSULE    Take 1 capsule (100 mg total) by mouth 2 (two) times daily. One po bid x 7 days   HYDROCODONE-HOMATROPINE (HYCODAN) 5-1.5 MG/5ML SYRUP    Take 5 mLs by mouth every 6 (six) hours as needed for cough.   ONDANSETRON (ZOFRAN) 4 MG TABLET    Take 1 tablet (4 mg total) by mouth every 8 (eight) hours as needed for nausea or vomiting.   PREDNISONE (DELTASONE) 20 MG TABLET    2 tabs po daily x 4 days    Note:  This note was  prepared with assistance of Conservation officer, historic buildings. Occasional wrong-word or sound-a-like substitutions may have occurred due to the inherent limitations of voice recognition software.   Nyaja [place], Barbara Cower, MD 04/21/18 [phone]

## 2018-04-21 MED ORDER — METHYLPREDNISOLONE SODIUM SUCC 125 MG IJ SOLR
125.0000 mg | Freq: Once | INTRAMUSCULAR | Status: AC
  Administered 2018-04-21: 125 mg via INTRAMUSCULAR
  Filled 2018-04-21: qty 2

## 2018-04-21 MED ORDER — PREDNISONE 20 MG PO TABS: ORAL_TABLET | ORAL | 0 refills | Status: DC

## 2018-04-21 MED ORDER — ALBUTEROL SULFATE (5 MG/ML) 0.5% IN NEBU: 2.5000 mg | INHALATION_SOLUTION | Freq: Four times a day (QID) | RESPIRATORY_TRACT | 12 refills | Status: DC | PRN

## 2018-04-21 MED ORDER — HYDROCODONE-HOMATROPINE 5-1.5 MG/5ML PO SYRP: 5.0000 mL | ORAL_SOLUTION | Freq: Four times a day (QID) | ORAL | 0 refills | Status: DC | PRN

## 2018-04-21 MED ORDER — DOXYCYCLINE HYCLATE 100 MG PO CAPS: 100.0000 mg | ORAL_CAPSULE | Freq: Two times a day (BID) | ORAL | 0 refills | Status: DC

## 2018-04-21 MED ORDER — ONDANSETRON HCL 4 MG PO TABS: 4.0000 mg | ORAL_TABLET | Freq: Three times a day (TID) | ORAL | 0 refills | Status: DC | PRN

## 2018-04-21 MED ORDER — PROMETHAZINE HCL 25 MG/ML IJ SOLN
25.0000 mg | Freq: Once | INTRAMUSCULAR | Status: AC
  Administered 2018-04-21: 25 mg via INTRAMUSCULAR
  Filled 2018-04-21: qty 1

## 2018-06-02 DIAGNOSIS — M5431 Sciatica, right side: Secondary | ICD-10-CM
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
## 2018-06-02 NOTE — ED Notes (Signed)
PIV established, good flash and easy flush. Blood specimen collected and sent to lab. PT tolerated well.

## 2018-06-02 NOTE — ED Notes (Signed)
I have reviewed discharge instructions with the patient.  The patient verbalized understanding. Patient NAD, VSS and pain is 5/10 at discharge.  Patient armband removed and shredded.

## 2018-06-02 NOTE — ED Notes (Signed)
Pt ambulatory into triage for c/o right sided lower back pain, RLQ abd pain, N/V and urinary frequency x3 days.     Denies hx of kidney stones, denies vaginal bleeding/discharge, denies dysuria/hematuria.

## 2018-06-02 NOTE — ED Triage Notes (Signed)
Pt ambulatory into triage for c/o right sided lower back pain, RLQ abd pain, N/V and urinary frequency x3 days.     Denies hx of kidney stones, denies vaginal bleeding/discharge, denies dysuria/hematuria.
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
## 2018-06-03 ENCOUNTER — Inpatient Hospital Stay
Admit: 2018-06-03 | Discharge: 2018-06-03 | Disposition: A | Discharge status: Home / Self Care | Payer: MEDICAID | Attending: Emergency Medicine

## 2018-06-03 LAB — URINALYSIS W/ RFLX MICROSCOPIC
Bilirubin, Urine: NEGATIVE
Bilirubin: NEGATIVE
Blood, Urine: NEGATIVE
Blood: NEGATIVE
Glucose, Ur: NEGATIVE mg/dL
Glucose: NEGATIVE mg/dL
Ketone: NEGATIVE mg/dL
Ketones, Urine: NEGATIVE mg/dL
Nitrite, Urine: NEGATIVE
Nitrites: NEGATIVE
Protein, UA: NEGATIVE mg/dL
Protein: NEGATIVE mg/dL
Specific Gravity, UA: 1.025 (ref 1.005–1.030)
Specific gravity: 1.025 (ref 1.005–1.030)
Urobilinogen, UA, POCT: 0.2 EU/dL (ref 0.2–1.0)
Urobilinogen: 0.2 EU/dL (ref 0.2–1.0)
pH (UA): 6 (ref 5.0–8.0)
pH, UA: 6 (ref 5.0–8.0)

## 2018-06-03 LAB — CBC WITH AUTOMATED DIFF
ABS. BASOPHILS: 0 10*3/uL (ref 0.0–0.1)
ABS. EOSINOPHILS: 0.5 10*3/uL — ABNORMAL HIGH (ref 0.0–0.4)
ABS. LYMPHOCYTES: 2.5 10*3/uL (ref 0.9–3.6)
ABS. MONOCYTES: 0.5 10*3/uL (ref 0.05–1.2)
ABS. NEUTROPHILS: 3.7 10*3/uL (ref 1.8–8.0)
BASOPHILS: 1 % (ref 0–2)
EOSINOPHILS: 7 % — ABNORMAL HIGH (ref 0–5)
HCT: 40 % (ref 35.0–45.0)
HGB: 12.5 g/dL (ref 12.0–16.0)
LYMPHOCYTES: 34 % (ref 21–52)
MCH: 28.5 PG (ref 24.0–34.0)
MCHC: 31.3 g/dL (ref 31.0–37.0)
MCV: 91.1 FL (ref 74.0–97.0)
MONOCYTES: 6 % (ref 3–10)
MPV: 9 FL — ABNORMAL LOW (ref 9.2–11.8)
NEUTROPHILS: 52 % (ref 40–73)
PLATELET: 272 10*3/uL (ref 135–420)
RBC: 4.39 M/uL (ref 4.20–5.30)
RDW: 13.9 % (ref 11.6–14.5)
WBC: 7.1 10*3/uL (ref 4.6–13.2)

## 2018-06-03 LAB — LIPASE
Lipase: 104 U/L (ref 73–393)
Lipase: 104 U/L (ref 73–393)

## 2018-06-03 LAB — URINE MICROSCOPIC ONLY
BACTERIA, URINE: NEGATIVE /hpf
Bacteria: NEGATIVE /hpf
RBC, UA: 0 /hpf (ref 0–5)
RBC: 0 /hpf (ref 0–5)
WBC, UA: 1 /hpf (ref 0–4)
WBC: 1 /hpf (ref 0–4)

## 2018-06-03 LAB — METABOLIC PANEL, COMPREHENSIVE
A-G Ratio: 1.1 (ref 0.8–1.7)
ALT (SGPT): 22 U/L (ref 13–56)
AST (SGOT): 13 U/L — ABNORMAL LOW (ref 15–37)
Albumin: 3.9 g/dL (ref 3.4–5.0)
Alk. phosphatase: 50 U/L (ref 45–117)
Anion gap: 4 mmol/L (ref 3.0–18)
BUN/Creatinine ratio: 19 (ref 12–20)
BUN: 17 MG/DL (ref 7.0–18)
Bilirubin, total: 0.3 MG/DL (ref 0.2–1.0)
CO2: 27 mmol/L (ref 21–32)
Calcium: 8.8 MG/DL (ref 8.5–10.1)
Chloride: 109 mmol/L — ABNORMAL HIGH (ref 100–108)
Creatinine: 0.91 MG/DL (ref 0.6–1.3)
GFR est AA: >60 mL/min/{1.73_m2} (ref >60)
GFR est non-AA: >60 mL/min/{1.73_m2} (ref >60)
Globulin: 3.5 g/dL (ref 2.0–4.0)
Glucose: 103 mg/dL — ABNORMAL HIGH (ref 74–99)
Potassium: 4 mmol/L (ref 3.5–5.5)
Protein, total: 7.4 g/dL (ref 6.4–8.2)
Sodium: 140 mmol/L (ref 136–145)

## 2018-06-03 LAB — HCG URINE, QL
HCG urine, QL: NEGATIVE
Pregnancy, Urine: NEGATIVE

## 2018-06-03 LAB — CBC WITH AUTO DIFFERENTIAL
Basophils %: 1 % (ref 0–2)
Basophils Absolute: 0 10*3/uL (ref 0.0–0.1)
Eosinophils %: 7 % — ABNORMAL HIGH (ref 0–5)
Eosinophils Absolute: 0.5 10*3/uL — ABNORMAL HIGH (ref 0.0–0.4)
Hematocrit: 40 % (ref 35.0–45.0)
Hemoglobin: 12.5 g/dL (ref 12.0–16.0)
Lymphocytes %: 34 % (ref 21–52)
Lymphocytes Absolute: 2.5 10*3/uL (ref 0.9–3.6)
MCH: 28.5 PG (ref 24.0–34.0)
MCHC: 31.3 g/dL (ref 31.0–37.0)
MCV: 91.1 FL (ref 74.0–97.0)
MPV: 9 FL — ABNORMAL LOW (ref 9.2–11.8)
Monocytes %: 6 % (ref 3–10)
Monocytes Absolute: 0.5 10*3/uL (ref 0.05–1.2)
Neutrophils %: 52 % (ref 40–73)
Neutrophils Absolute: 3.7 10*3/uL (ref 1.8–8.0)
Platelets: 272 10*3/uL (ref 135–420)
RBC: 4.39 M/uL (ref 4.20–5.30)
RDW: 13.9 % (ref 11.6–14.5)
WBC: 7.1 10*3/uL (ref 4.6–13.2)

## 2018-06-03 LAB — COMPREHENSIVE METABOLIC PANEL
ALT: 22 U/L (ref 13–56)
AST: 13 U/L — ABNORMAL LOW (ref 15–37)
Albumin/Globulin Ratio: 1.1 (ref 0.8–1.7)
Albumin: 3.9 g/dL (ref 3.4–5.0)
Alkaline Phosphatase: 50 U/L (ref 45–117)
Anion Gap: 4 mmol/L (ref 3.0–18)
BUN/Creatinine Ratio: 19 (ref 12–20)
BUN: 17 MG/DL (ref 7.0–18)
CO2: 27 mmol/L (ref 21–32)
Calcium: 8.8 MG/DL (ref 8.5–10.1)
Chloride: 109 mmol/L — ABNORMAL HIGH (ref 100–108)
Creatinine: 0.91 MG/DL (ref 0.6–1.3)
GFR African American: >60 mL/min/{1.73_m2} (ref >60)
Globulin: 3.5 g/dL (ref 2.0–4.0)
Glucose: 103 mg/dL — ABNORMAL HIGH (ref 74–99)
Potassium: 4 mmol/L (ref 3.5–5.5)
Sodium: 140 mmol/L (ref 136–145)
Total Bilirubin: 0.3 MG/DL (ref 0.2–1.0)
Total Protein: 7.4 g/dL (ref 6.4–8.2)
eGFR NON-AA: >60 mL/min/{1.73_m2} (ref >60)

## 2018-06-03 MED ORDER — CYCLOBENZAPRINE 10 MG TAB
10 mg | ORAL_TABLET | Freq: Three times a day (TID) | ORAL | 0 refills | Status: AC | PRN
Start: 2018-06-03 — End: ?

## 2018-06-03 MED ORDER — PREDNISONE 10 MG TAB
10 mg | ORAL | Status: AC
Start: 2018-06-03 — End: 2018-06-02
  Administered 2018-06-03: 02:00:00 via ORAL

## 2018-06-03 MED ORDER — METHYLPREDNISOLONE 4 MG TABS IN A DOSE PACK
4 mg | ORAL | 0 refills | Status: AC
Start: 2018-06-03 — End: ?

## 2018-06-03 MED ORDER — IBUPROFEN 800 MG TAB
800 mg | ORAL_TABLET | Freq: Three times a day (TID) | ORAL | 0 refills | Status: AC
Start: 2018-06-03 — End: 2018-06-07

## 2018-06-03 MED ORDER — KETOROLAC TROMETHAMINE 30 MG/ML INJECTION
30 mg/mL (1 mL) | INTRAMUSCULAR | Status: AC
Start: 2018-06-03 — End: 2018-06-02
  Administered 2018-06-03: 02:00:00 via INTRAVENOUS

## 2018-06-03 MED FILL — KETOROLAC TROMETHAMINE 30 MG/ML INJECTION: 30 mg/mL (1 mL) | INTRAMUSCULAR | Qty: 1

## 2018-06-03 MED FILL — PREDNISONE 10 MG TAB: 10 mg | ORAL | Qty: 6

## 2018-08-01 ENCOUNTER — Emergency Department (HOSPITAL_COMMUNITY)
Admission: EM | Admit: 2018-08-01 | Discharge: 2018-08-02 | Disposition: A | Discharge status: Home / Self Care | Payer: Medicaid - Out of State | Attending: Emergency Medicine | Admitting: Emergency Medicine

## 2018-08-01 ENCOUNTER — Encounter (HOSPITAL_COMMUNITY): Payer: Self-pay

## 2018-08-01 DIAGNOSIS — R0602 Shortness of breath: Secondary | ICD-10-CM | POA: Diagnosis present

## 2018-08-01 DIAGNOSIS — F17210 Nicotine dependence, cigarettes, uncomplicated: Secondary | ICD-10-CM | POA: Diagnosis not present

## 2018-08-01 DIAGNOSIS — Z79899 Other long term (current) drug therapy: Secondary | ICD-10-CM | POA: Diagnosis not present

## 2018-08-01 DIAGNOSIS — J4521 Mild intermittent asthma with (acute) exacerbation: Secondary | ICD-10-CM | POA: Insufficient documentation

## 2018-08-01 MED ORDER — ALBUTEROL SULFATE (2.5 MG/3ML) 0.083% IN NEBU
5.0000 mg | INHALATION_SOLUTION | Freq: Once | RESPIRATORY_TRACT | Status: AC
  Administered 2018-08-01: 5 mg via RESPIRATORY_TRACT
  Filled 2018-08-01: qty 6

## 2018-08-01 NOTE — ED Triage Notes (Signed)
Pt states that she ran out of her asthma medication last night, wheezing, has been around dust

## 2018-08-02 MED ORDER — IPRATROPIUM BROMIDE 0.02 % IN SOLN
0.5000 mg | Freq: Once | RESPIRATORY_TRACT | Status: AC
  Administered 2018-08-02: 0.5 mg via RESPIRATORY_TRACT
  Filled 2018-08-02: qty 2.5

## 2018-08-02 MED ORDER — ALBUTEROL SULFATE (2.5 MG/3ML) 0.083% IN NEBU
5.0000 mg | INHALATION_SOLUTION | Freq: Once | RESPIRATORY_TRACT | Status: AC
  Administered 2018-08-02: 5 mg via RESPIRATORY_TRACT
  Filled 2018-08-02: qty 6

## 2018-08-02 MED ORDER — ALBUTEROL SULFATE HFA 108 (90 BASE) MCG/ACT IN AERS
1.0000 | INHALATION_SPRAY | Freq: Four times a day (QID) | RESPIRATORY_TRACT | 0 refills | Status: DC | PRN
Start: 2018-08-02 — End: 2019-12-14

## 2018-08-02 MED ORDER — ALBUTEROL SULFATE (5 MG/ML) 0.5% IN NEBU: 5.0000 mg | INHALATION_SOLUTION | Freq: Four times a day (QID) | RESPIRATORY_TRACT | 0 refills | Status: DC | PRN

## 2018-08-02 MED ORDER — ALBUTEROL SULFATE HFA 108 (90 BASE) MCG/ACT IN AERS
2.0000 | INHALATION_SPRAY | Freq: Once | RESPIRATORY_TRACT | Status: AC
  Administered 2018-08-02: 2 via RESPIRATORY_TRACT
  Filled 2018-08-02: qty 6.7

## 2018-08-02 NOTE — Discharge Instructions (Signed)
Take the prescribed medication as directed.  Continue your allegra. Follow-up with your primary care doctor. Return to the ED for new or worsening symptoms.

## 2018-08-02 NOTE — ED Provider Notes (Signed)
[facility] EMERGENCY DEPARTMENT Provider Note   CSN: [id number] Arrival date & time: 08/01/18  2234     History   Chief Complaint Chief Complaint  Patient presents with  . Asthma    HPI Teresa Peterson is a 33 y.o. female.  The history is provided by the patient and medical records.  Asthma  Associated symptoms include shortness of breath.    32 year old female with history of asthma, emphysema, headaches, presenting to the ED with asthma exacerbation.  Patient reports yesterday she woke up with sore throat, sneezing, cough, and increased shortness of breath.  States today she was trying to clean her ceiling fan that had a lot of dust on it and symptoms got worse.  She is out of all of her home asthma medications, albuterol inhaler and neb solution.  She denies any fever or chills.  No chest pain.  No nausea or vomiting.  Past Medical History:  Diagnosis Date  . Asthma   . Emphysema of lung (HCC)   . Headache(784.0)    Migraines  . Heart murmur     Patient Active Problem List   Diagnosis Date Noted  . Chest pain at rest 11/29/2011  . SOB (shortness of breath) 11/29/2011  . Chest pain 11/29/2011  . Asthma exacerbation 11/08/2011  . Tinea versicolor 11/08/2011  . Cough 11/08/2011    History reviewed. No pertinent surgical history.   OB History   None      Home Medications    Prior to Admission medications   Medication Sig Start Date End Date Taking? Authorizing Provider  albuterol (PROVENTIL HFA;VENTOLIN HFA) 108 (90 BASE) MCG/ACT inhaler Inhale 2 puffs into the lungs every 6 (six) hours as needed. 11/29/11   Etta Grandchild, MD  albuterol (PROVENTIL) (5 MG/ML) 0.5% nebulizer solution Take 0.5 mLs (2.5 mg total) by nebulization every 6 (six) hours as needed for wheezing or shortness of breath. 04/21/18   Mesner, Barbara Cower, MD  doxycycline (VIBRAMYCIN) 100 MG capsule Take 1 capsule (100 mg total) by mouth 2 (two) times daily. One po bid x 7  days 04/21/18   Mesner, Barbara Cower, MD  HYDROcodone-homatropine [facility]) 5-1.5 MG/5ML syrup Take 5 mLs by mouth every 6 (six) hours as needed for cough. 04/21/18   Mesner, Barbara Cower, MD  ondansetron (ZOFRAN) 4 MG tablet Take 1 tablet (4 mg total) by mouth every 8 (eight) hours as needed for nausea or vomiting. 04/21/18   Mesner, Barbara Cower, MD  predniSONE (DELTASONE) 20 MG tablet 2 tabs po daily x 4 days 04/21/18   Mesner, Barbara Cower, MD    Family History Family History  Problem Relation Age of Onset  . Alcohol abuse Other        grandparents  . Arthritis Other        grandparent  . Cancer Other        relative  . Stroke Other        grandparent  . Kidney disease Other        grandparents  . Diabetes Other        grandparent  . Asthma Brother     Social History Social History   Tobacco Use  . Smoking status: Current Every Day Smoker    Types: Cigarettes    Last attempt to quit: 07/08/2011    Years since quitting: 7.0  . Smokeless tobacco: Never Used  Substance Use Topics  . Alcohol use: Yes    Comment: drink 1 bottle wine/day  . Drug  use: No    Comment: quit 06/2011     Allergies   Sudafed [pseudoephedrine hcl]   Review of Systems Review of Systems  HENT: Positive for sneezing.   Respiratory: Positive for cough, shortness of breath and wheezing.   All other systems reviewed and are negative.    Physical Exam Updated Vital Signs BP (!) 146/92 (BP Location: Right Arm)   Pulse (!) 101   Temp 98.9 F (37.2 C) (Oral)   Resp 16   SpO2 100%   Physical Exam  Constitutional: She is oriented to person, place, and time. She appears well-developed and well-nourished.  HENT:  Head: Normocephalic and atraumatic.  Right Ear: Tympanic membrane and ear canal normal.  Left Ear: Tympanic membrane and ear canal normal.  Nose: Mucosal edema present.  Mouth/Throat: Uvula is midline, oropharynx is clear and moist and mucous membranes are normal.  + nasal congestion  Eyes: Pupils are equal,  round, and reactive to light. Conjunctivae and EOM are normal.  Neck: Normal range of motion.  Cardiovascular: Normal rate, regular rhythm and normal heart sounds.  Pulmonary/Chest: Effort normal. No stridor. No respiratory distress. She has wheezes.  Diffuse wheezes, NAD, speaking in full sentences without difficulty  Abdominal: Soft. Bowel sounds are normal. There is no tenderness. There is no rebound.  Musculoskeletal: Normal range of motion.  Neurological: She is alert and oriented to person, place, and time.  Skin: Skin is warm and dry.  Psychiatric: She has a normal mood and affect.  Nursing note and vitals reviewed.    ED Treatments / Results  Labs (all labs ordered are listed, but only abnormal results are displayed) Labs Reviewed - No data to display  EKG None  Radiology No results found.  Procedures Procedures (including critical care time)  Medications Ordered in ED Medications  albuterol (PROVENTIL) (2.5 MG/3ML) 0.083% nebulizer solution 5 mg (has no administration in time range)  ipratropium (ATROVENT) nebulizer solution 0.5 mg (has no administration in time range)  albuterol (PROVENTIL) (2.5 MG/3ML) 0.083% nebulizer solution 5 mg (5 mg Nebulization Given 08/01/18 2248)     Initial Impression / Assessment and Plan / ED Course  I have reviewed the triage vital signs and the nursing notes.  Pertinent labs & imaging results that were available during my care of the patient were reviewed by me and considered in my medical decision making (see chart for details).  32 year old female here with asthma exacerbation.  Has had cough and sneezing, worse today after cleaning a dusty ceiling fan.  She is afebrile and nontoxic.  On exam she has diffuse wheezes but no acute distress.  Lung sounds cleared after DuoNeb here.  Reports she is feeling better.  Vitals remained stable.  Feel she is stable for discharge home.  I have refilled her home neb solution as well as given her  inhaler here in ED.  She will follow-up closely with her primary care doctor.  Discussed plan with patient, she acknowledged understanding and agreed with plan of care.  Return precautions given for new or worsening symptoms.  Final Clinical Impressions(s) / ED Diagnoses   Final diagnoses:  Mild intermittent asthma with exacerbation    ED Discharge Orders         Ordered    albuterol (PROVENTIL HFA;VENTOLIN HFA) 108 (90 Base) MCG/ACT inhaler  Every 6 hours PRN     08/02/18 0121    albuterol (PROVENTIL) (5 MG/ML) 0.5% nebulizer solution  Every 6 hours PRN  08/02/18 0121           Garlon HatchetSanders, Samarrah Tranchina M, PA-C 08/02/18 0130    Nira Connardama, Pedro Eduardo, MD 08/02/18 [phone]

## 2018-08-04 ENCOUNTER — Emergency Department (HOSPITAL_BASED_OUTPATIENT_CLINIC_OR_DEPARTMENT_OTHER)
Admission: EM | Admit: 2018-08-04 | Discharge: 2018-08-05 | Disposition: A | Discharge status: Home / Self Care | Payer: Medicaid - Out of State | Attending: Emergency Medicine | Admitting: Emergency Medicine

## 2018-08-04 ENCOUNTER — Other Ambulatory Visit: Payer: Self-pay

## 2018-08-04 ENCOUNTER — Encounter (HOSPITAL_BASED_OUTPATIENT_CLINIC_OR_DEPARTMENT_OTHER): Payer: Self-pay | Admitting: *Deleted

## 2018-08-04 DIAGNOSIS — F17210 Nicotine dependence, cigarettes, uncomplicated: Secondary | ICD-10-CM | POA: Diagnosis not present

## 2018-08-04 DIAGNOSIS — R0602 Shortness of breath: Secondary | ICD-10-CM | POA: Diagnosis present

## 2018-08-04 DIAGNOSIS — J4521 Mild intermittent asthma with (acute) exacerbation: Secondary | ICD-10-CM

## 2018-08-04 HISTORY — DX: Patient's other noncompliance with medication regimen for other reason: Z91.148

## 2018-08-04 HISTORY — DX: Patient's other noncompliance with medication regimen: Z91.14

## 2018-08-04 MED ORDER — ALBUTEROL SULFATE (2.5 MG/3ML) 0.083% IN NEBU
INHALATION_SOLUTION | RESPIRATORY_TRACT | Status: AC
  Filled 2018-08-04: qty 3

## 2018-08-04 MED ORDER — IPRATROPIUM-ALBUTEROL 0.5-2.5 (3) MG/3ML IN SOLN
3.0000 mL | Freq: Once | RESPIRATORY_TRACT | Status: AC
  Administered 2018-08-04: 3 mL via RESPIRATORY_TRACT

## 2018-08-04 MED ORDER — IPRATROPIUM-ALBUTEROL 0.5-2.5 (3) MG/3ML IN SOLN
RESPIRATORY_TRACT | Status: AC
  Filled 2018-08-04: qty 3

## 2018-08-04 MED ORDER — ALBUTEROL SULFATE (2.5 MG/3ML) 0.083% IN NEBU
5.0000 mg | INHALATION_SOLUTION | Freq: Once | RESPIRATORY_TRACT | Status: AC
  Administered 2018-08-04: 5 mg via RESPIRATORY_TRACT
  Filled 2018-08-04: qty 6

## 2018-08-04 MED ORDER — ALBUTEROL SULFATE (2.5 MG/3ML) 0.083% IN NEBU
2.5000 mg | INHALATION_SOLUTION | Freq: Once | RESPIRATORY_TRACT | Status: AC
  Administered 2018-08-04: 2.5 mg via RESPIRATORY_TRACT

## 2018-08-04 NOTE — ED Triage Notes (Signed)
Pt reports SOB x 3 days (although she was seen 3 days ago for same).  States that she was dx with allergies. States that she feels like she needs steroids. Cough noted in triage.

## 2018-08-05 MED ORDER — PREDNISONE 50 MG PO TABS: ORAL_TABLET | ORAL | 0 refills | Status: DC

## 2018-08-05 MED ORDER — PREDNISONE 10 MG PO TABS
60.0000 mg | ORAL_TABLET | Freq: Once | ORAL | Status: AC
  Administered 2018-08-05: 02:00:00 60 mg via ORAL
  Filled 2018-08-05: qty 1

## 2018-08-05 MED ORDER — BUDESONIDE-FORMOTEROL FUMARATE 160-4.5 MCG/ACT IN AERO: 2.0000 | INHALATION_SPRAY | Freq: Two times a day (BID) | RESPIRATORY_TRACT | 12 refills | Status: DC

## 2018-08-05 NOTE — ED Provider Notes (Signed)
[facility] EMERGENCY DEPARTMENT Provider Note   CSN: [id number] Arrival date & time: 08/04/18  2225     History   Chief Complaint Chief Complaint  Patient presents with  . Asthma    HPI Teresa Peterson is a 32 y.o. female.  The history is provided by the patient.  Asthma  This is a recurrent problem. The current episode started more than 2 days ago. The problem occurs daily. The problem has been gradually worsening. Associated symptoms include shortness of breath. Pertinent negatives include no chest pain and no abdominal pain. Nothing aggravates the symptoms. Nothing relieves the symptoms.   She with known history of asthma reports recurrent asthma exacerbation.  She reports cough, wheezing, shortness of breath.  This started several days ago, was treated and felt improved, but worsened this morning.  It has been gradually worsening all day.  It is very similar to prior episodes. She reports posttussive emesis.  No fever.  She is a smoker.  She reports previous history of ICU admission Past Medical History:  Diagnosis Date  . Asthma   . Emphysema of lung (HCC)   . Headache(784.0)    Migraines  . Heart murmur   . Noncompliance with medications     Patient Active Problem List   Diagnosis Date Noted  . Chest pain at rest 11/29/2011  . SOB (shortness of breath) 11/29/2011  . Chest pain 11/29/2011  . Asthma exacerbation 11/08/2011  . Tinea versicolor 11/08/2011  . Cough 11/08/2011    History reviewed. No pertinent surgical history.   OB History   None      Home Medications    Prior to Admission medications   Medication Sig Start Date End Date Taking? Authorizing Provider  albuterol (PROVENTIL HFA;VENTOLIN HFA) 108 (90 Base) MCG/ACT inhaler Inhale 1-2 puffs into the lungs every 6 (six) hours as needed for wheezing. 08/02/18   Garlon HatchetSanders, Lisa M, PA-C  albuterol (PROVENTIL) (5 MG/ML) 0.5% nebulizer solution Take 1 mL (5 mg total) by nebulization every 6  (six) hours as needed for wheezing or shortness of breath. 08/02/18   Garlon HatchetSanders, Lisa M, PA-C  budesonide-formoterol [facility](SYMBICORT) 160-4.5 MCG/ACT inhaler Inhale 2 puffs into the lungs 2 (two) times daily. 08/05/18   Zadie RhineWickline, Dannika Hilgeman, MD  predniSONE (DELTASONE) 50 MG tablet 1 tablet PO QD X4 days 08/05/18   Zadie RhineWickline, Hadi Dubin, MD    Family History Family History  Problem Relation Age of Onset  . Alcohol abuse Other        grandparents  . Arthritis Other        grandparent  . Cancer Other        relative  . Stroke Other        grandparent  . Kidney disease Other        grandparents  . Diabetes Other        grandparent  . Asthma Brother     Social History Social History   Tobacco Use  . Smoking status: Current Every Day Smoker    Types: Cigarettes    Last attempt to quit: 07/08/2011    Years since quitting: 7.0  . Smokeless tobacco: Never Used  Substance Use Topics  . Alcohol use: Yes    Comment: drink 1 bottle wine/day  . Drug use: No    Comment: quit 06/2011     Allergies   Sudafed [pseudoephedrine hcl]   Review of Systems Review of Systems  Constitutional: Negative for fever.  Respiratory: Positive for cough, shortness  of breath and wheezing.   Cardiovascular: Negative for chest pain and leg swelling.  Gastrointestinal: Negative for abdominal pain.  All other systems reviewed and are negative.    Physical Exam Updated Vital Signs BP (!) 142/90 (BP Location: Right Arm)   Pulse (!) 101   Temp 99.2 F (37.3 C) (Oral)   Resp 20   Ht 1.626 m (5\' 4" )   Wt 85.3 kg   SpO2 100%   BMI 32.27 kg/m   Physical Exam CONSTITUTIONAL: Well developed/well nourished, resting comfortably in the room HEAD: Normocephalic/atraumatic EYES: EOMI/PERRL ENMT: Mucous membranes moist NECK: supple no meningeal signs SPINE/BACK:entire spine nontender CV: S1/S2 noted, no murmurs/rubs/gallops noted LUNGS: Scattered wheezing bilaterally ABDOMEN: soft, nontender, no rebound or  guarding, bowel sounds noted throughout abdomen GU:no cva tenderness NEURO: Pt is awake/alert/appropriate, moves all extremitiesx4.  No facial droop.   EXTREMITIES: pulses normal/equal, full ROM no lower extremity edema SKIN: warm, color normal PSYCH: no abnormalities of mood noted, alert and oriented to situation  ED Treatments / Results  Labs (all labs ordered are listed, but only abnormal results are displayed) Labs Reviewed - No data to display  EKG None  Radiology No results found.  Procedures Procedures   Medications Ordered in ED Medications  albuterol (PROVENTIL) (2.5 MG/3ML) 0.083% nebulizer solution (has no administration in time range)  ipratropium-albuterol (DUONEB) 0.5-2.5 (3) MG/3ML nebulizer solution (has no administration in time range)  albuterol (PROVENTIL) (2.5 MG/3ML) 0.083% nebulizer solution 2.5 mg (2.5 mg Nebulization Given 08/04/18 2245)  ipratropium-albuterol (DUONEB) 0.5-2.5 (3) MG/3ML nebulizer solution 3 mL (3 mLs Nebulization Given 08/04/18 2245)  albuterol (PROVENTIL) (2.5 MG/3ML) 0.083% nebulizer solution 5 mg (5 mg Nebulization Given 08/04/18 2343)  predniSONE (DELTASONE) tablet 60 mg (60 mg Oral Given 08/05/18 0135)     Initial Impression / Assessment and Plan / ED Course  I have reviewed the triage vital signs and the nursing notes.       By the Time of my evaluation patient was already improving after nebulized therapy.  She is awake alert in no distress.  No hypoxia.  No respiratory distress noted.  She would need to start steroids.  She also requests refill of her Symbicort.  I do not feel further imaging or labs are warranted.  She is advised to quit smoking  Final Clinical Impressions(s) / ED Diagnoses   Final diagnoses:  Mild intermittent asthma with exacerbation    ED Discharge Orders         Ordered    predniSONE (DELTASONE) 50 MG tablet     08/05/18 0130    budesonide-formoterol (SYMBICORT) 160-4.5 MCG/ACT inhaler  2 times  daily     08/05/18 0130           Zadie RhineWickline, Raquell Richer, MD 08/05/18 [phone]

## 2018-08-05 NOTE — ED Notes (Signed)
Pt. Able to speak full clear sentences.  No distress noted upon assessment of Pt.

## 2019-06-06 ENCOUNTER — Other Ambulatory Visit: Payer: Self-pay

## 2019-06-06 ENCOUNTER — Encounter (HOSPITAL_COMMUNITY): Payer: Self-pay | Admitting: Emergency Medicine

## 2019-06-06 ENCOUNTER — Emergency Department (HOSPITAL_COMMUNITY)
Admission: EM | Admit: 2019-06-06 | Discharge: 2019-06-06 | Disposition: A | Discharge status: Home / Self Care | Payer: Medicaid - Out of State | Attending: Emergency Medicine | Admitting: Emergency Medicine

## 2019-06-06 DIAGNOSIS — Z79899 Other long term (current) drug therapy: Secondary | ICD-10-CM | POA: Insufficient documentation

## 2019-06-06 DIAGNOSIS — F17210 Nicotine dependence, cigarettes, uncomplicated: Secondary | ICD-10-CM | POA: Diagnosis not present

## 2019-06-06 DIAGNOSIS — J45909 Unspecified asthma, uncomplicated: Secondary | ICD-10-CM | POA: Insufficient documentation

## 2019-06-06 DIAGNOSIS — M5431 Sciatica, right side: Secondary | ICD-10-CM | POA: Diagnosis not present

## 2019-06-06 DIAGNOSIS — B86 Scabies: Secondary | ICD-10-CM | POA: Diagnosis not present

## 2019-06-06 DIAGNOSIS — K5900 Constipation, unspecified: Secondary | ICD-10-CM | POA: Diagnosis not present

## 2019-06-06 DIAGNOSIS — M545 Low back pain: Secondary | ICD-10-CM | POA: Diagnosis present

## 2019-06-06 MED ORDER — NAPROXEN 500 MG PO TABS: 500.0000 mg | ORAL_TABLET | Freq: Two times a day (BID) | ORAL | 0 refills | Status: DC

## 2019-06-06 MED ORDER — PERMETHRIN 5 % EX CREA: TOPICAL_CREAM | CUTANEOUS | 1 refills | Status: DC

## 2019-06-06 MED ORDER — DEXAMETHASONE SODIUM PHOSPHATE 10 MG/ML IJ SOLN
10.0000 mg | Freq: Once | INTRAMUSCULAR | Status: AC
  Administered 2019-06-06: 13:00:00 10 mg via INTRAMUSCULAR
  Filled 2019-06-06: qty 1

## 2019-06-06 NOTE — ED Provider Notes (Signed)
[facility] EMERGENCY DEPARTMENT Provider Note   CSN: [id number] Arrival date & time: 06/06/19  1229    History   Chief Complaint Chief Complaint  Patient presents with  . Back Pain    HPI Teresa Peterson is a 33 y.o. female.     HPI  Pleasant 33 year old female presents with a complaint of lower back pain radiating into her right leg posteriorly.  She reports this is been going on for several days worsening, history of the same after having a fall at work many years ago.  She denies any numbness or weakness still has occasional pins-and-needles feeling in her leg when she is walking.  She denies any dysuria hematuria IV drug use cancer or any other back pain or arm or leg pain.  She has been taking some over-the-counter medications without relief.  She reports this is the same as the pain she has had in the past  She also reports that after sleeping in a hotel last week she has developed an itchy rash on her body which she thinks is scabies  She also reports a history of constipation whenever she has sciatica and has not had a bowel movement in 5 days.  Is passing urine without any difficulty.  Past Medical History:  Diagnosis Date  . Asthma   . Emphysema of lung (HCC)   . Headache(784.0)    Migraines  . Heart murmur   . Noncompliance with medications     Patient Active Problem List   Diagnosis Date Noted  . Chest pain at rest 11/29/2011  . SOB (shortness of breath) 11/29/2011  . Chest pain 11/29/2011  . Asthma exacerbation 11/08/2011  . Tinea versicolor 11/08/2011  . Cough 11/08/2011    History reviewed. No pertinent surgical history.   OB History    Gravida  0   Para  0   Term  0   Preterm  0   AB  0   Living  0     SAB  0   TAB  0   Ectopic  0   Multiple  0   Live Births  0            Home Medications    Prior to Admission medications   Medication Sig Start Date End Date Taking? Authorizing Provider  albuterol (PROVENTIL  HFA;VENTOLIN HFA) 108 (90 Base) MCG/ACT inhaler Inhale 1-2 puffs into the lungs every 6 (six) hours as needed for wheezing. 08/02/18   Garlon HatchetSanders, Lisa M, PA-C  albuterol (PROVENTIL) (5 MG/ML) 0.5% nebulizer solution Take 1 mL (5 mg total) by nebulization every 6 (six) hours as needed for wheezing or shortness of breath. 08/02/18   Garlon HatchetSanders, Lisa M, PA-C  budesonide-formoterol [facility](SYMBICORT) 160-4.5 MCG/ACT inhaler Inhale 2 puffs into the lungs 2 (two) times daily. 08/05/18   Zadie RhineWickline, Donald, MD  naproxen (NAPROSYN) 500 MG tablet Take 1 tablet (500 mg total) by mouth 2 (two) times daily with a meal. 06/06/19   Eber HongMiller, Lauri Till, MD  permethrin (ELIMITE) 5 % cream Apply to entire body other than face - let sit for 14 hours then wash off, may repeat in 1 week if still having symptoms 06/06/19   Eber HongMiller, Brailon Don, MD  predniSONE (DELTASONE) 50 MG tablet 1 tablet PO QD X4 days 08/05/18   Zadie RhineWickline, Donald, MD    Family History Family History  Problem Relation Age of Onset  . Alcohol abuse Other        grandparents  . Arthritis Other  grandparent  . Cancer Other        relative  . Stroke Other        grandparent  . Kidney disease Other        grandparents  . Diabetes Other        grandparent  . Asthma Brother     Social History Social History   Tobacco Use  . Smoking status: Current Every Day Smoker    Types: Cigarettes    Last attempt to quit: 07/08/2011    Years since quitting: 7.9  . Smokeless tobacco: Never Used  Substance Use Topics  . Alcohol use: Yes    Comment: drink 1 bottle wine/day  . Drug use: No    Comment: quit 06/2011     Allergies   Sudafed [pseudoephedrine hcl]   Review of Systems Review of Systems  Constitutional: Negative for chills and fever.  Respiratory: Negative for cough and shortness of breath.   Cardiovascular: Negative for chest pain and leg swelling.  Musculoskeletal: Positive for back pain.  Skin: Positive for rash.  Neurological: Negative for weakness  and numbness.  Hematological: Does not bruise/bleed easily.     Physical Exam Updated Vital Signs BP (!) 159/110 (BP Location: Right Arm)   Pulse (!) 113   Temp 98.3 F (36.8 C) (Oral)   Resp 14   Ht 1.6 m (5\' 3" )   Wt 90.7 kg   LMP 06/02/2019   SpO2 99%   BMI 35.43 kg/m   Physical Exam Vitals signs and nursing note reviewed.  Constitutional:      General: She is not in acute distress.    Appearance: She is well-developed.  HENT:     Head: Normocephalic and atraumatic.     Mouth/Throat:     Pharynx: No oropharyngeal exudate.  Eyes:     General: No scleral icterus.       Right eye: No discharge.        Left eye: No discharge.     Conjunctiva/sclera: Conjunctivae normal.     Pupils: Pupils are equal, round, and reactive to light.  Neck:     Musculoskeletal: Normal range of motion and neck supple.     Thyroid: No thyromegaly.     Vascular: No JVD.  Cardiovascular:     Rate and Rhythm: Normal rate and regular rhythm.     Heart sounds: Normal heart sounds. No murmur. No friction rub. No gallop.   Pulmonary:     Effort: Pulmonary effort is normal. No respiratory distress.     Breath sounds: Normal breath sounds. No wheezing or rales.  Abdominal:     General: Bowel sounds are normal. There is no distension.     Palpations: Abdomen is soft. There is no mass.     Tenderness: There is no abdominal tenderness.  Musculoskeletal: Normal range of motion.        General: Tenderness present.     Comments: Mild tenderness to the lower back  Lymphadenopathy:     Cervical: No cervical adenopathy.  Skin:    General: Skin is warm and dry.     Findings: Rash present. No erythema.     Comments: Scattered scabbed over.  Rash to the left shoulder, left leg, right arm there is no intertriginous spread  Neurological:     Mental Status: She is alert.     Coordination: Coordination normal.     Comments: Normal reflexes at the knees bilaterally, totally normal range of motion of the  bilateral legs, strength at the hips knees and ankles and sensation diffusely.  There is no straight leg raise pain  Psychiatric:        Behavior: Behavior normal.      ED Treatments / Results  Labs (all labs ordered are listed, but only abnormal results are displayed) Labs Reviewed - No data to display  EKG None  Radiology No results found.  Procedures Procedures (including critical care time)  Medications Ordered in ED Medications  dexamethasone (DECADRON) injection 10 mg (has no administration in time range)     Initial Impression / Assessment and Plan / ED Course  I have reviewed the triage vital signs and the nursing notes.  Pertinent labs & imaging results that were available during my care of the patient were reviewed by me and considered in my medical decision making (see chart for details).        The patient could be treated for these 3 etiologies individually  She likely has scabies  She likely is constipated  She likely does have some element of sciatica for which she will be treated in the emergency department.  Stable for discharge, aware of the indications for return.    Final Clinical Impressions(s) / ED Diagnoses   Final diagnoses:  Sciatica of right side  Scabies  Constipation, unspecified constipation type    ED Discharge Orders         Ordered    permethrin (ELIMITE) 5 % cream     06/06/19 1303    naproxen (NAPROSYN) 500 MG tablet  2 times daily with meals     06/06/19 1303           Noemi Chapel, MD 06/06/19 1303

## 2019-06-06 NOTE — Discharge Instructions (Addendum)
Please take naproxen twice a day, as taken twice a day in conjunction with the steroid that we gave you today your pain should gradually improve.  MiraLAX daily until you are having regular soft stools  Permethrin cream, apply topically tonight at 8:00 and wash off tomorrow at 8:00 in the morning.  Please see the attached reading instructions regarding how to sterilize her home.  [facility] Primary Care Doctor List    Sinda Du MD. Specialty: Pulmonary Disease Contact information: [place]  [place] [address]  [phone]   Tula Nakayama, MD. Specialty: [facility] Medicine Contact information: [address], [address] [place] [address]  [phone]   Sallee Lange, MD. Specialty: [facility] Medicine Contact information: [place]  [place] [address]  [phone]   Rosita Fire, MD Specialty: Internal Medicine Contact information: [place] [address]  [phone]   Delphina Cahill, MD. Specialty: Internal Medicine Contact information: [place] [address]  [phone]    [facility] (Dr. Maudie Mercury) Specialty: Family Medicine Contact information: [place] [address]  [phone]   Leslie Andrea, MD. Specialty: [facility] Medicine Contact information: [place] [address]  [phone]   Asencion Noble, MD. Specialty: Internal Medicine Contact information: [address]  [place] [address]  [place]  [address] [place], [place] [address] [phone]  Services [facility] offers a variety of basic health services.  Services include but are not limited to: Blood pressure checks  Heart rate checks  Blood sugar checks  Urine analysis  Rapid strep tests  Pregnancy tests.  Health education and referrals  People  needing more complex services will be directed to a physician online. Using these virtual visits, doctors can evaluate and prescribe medicine and treatments. There will be no medication on-site, though [facility] will help patients fill their prescriptions at little to no cost.   For More information please go to: [email or web address]

## 2019-06-06 NOTE — ED Triage Notes (Signed)
Patient c/o lower back pain that radiates down right leg x1 week. Patient reports using external electric nerve stimulator with no relief. Patient reports some constipation, last BM Monday. Patient also c/o possible scabies. Patient reports rash on legs and arms with itching. Per patient recently stayed at hotel before rash started.

## 2019-12-14 ENCOUNTER — Encounter (HOSPITAL_COMMUNITY): Payer: Self-pay

## 2019-12-14 ENCOUNTER — Other Ambulatory Visit: Payer: Self-pay

## 2019-12-14 ENCOUNTER — Emergency Department (HOSPITAL_COMMUNITY)
Admission: EM | Admit: 2019-12-14 | Discharge: 2019-12-14 | Disposition: A | Discharge status: Home / Self Care | Payer: Medicaid - Out of State | Attending: Emergency Medicine | Admitting: Emergency Medicine

## 2019-12-14 ENCOUNTER — Emergency Department (HOSPITAL_COMMUNITY): Payer: Medicaid - Out of State

## 2019-12-14 DIAGNOSIS — Y939 Activity, unspecified: Secondary | ICD-10-CM | POA: Insufficient documentation

## 2019-12-14 DIAGNOSIS — S098XXA Other specified injuries of head, initial encounter: Secondary | ICD-10-CM | POA: Diagnosis not present

## 2019-12-14 DIAGNOSIS — F17210 Nicotine dependence, cigarettes, uncomplicated: Secondary | ICD-10-CM | POA: Insufficient documentation

## 2019-12-14 DIAGNOSIS — Y999 Unspecified external cause status: Secondary | ICD-10-CM | POA: Diagnosis not present

## 2019-12-14 DIAGNOSIS — Y929 Unspecified place or not applicable: Secondary | ICD-10-CM | POA: Diagnosis not present

## 2019-12-14 DIAGNOSIS — R0602 Shortness of breath: Secondary | ICD-10-CM | POA: Diagnosis not present

## 2019-12-14 DIAGNOSIS — Z20828 Contact with and (suspected) exposure to other viral communicable diseases: Secondary | ICD-10-CM | POA: Insufficient documentation

## 2019-12-14 DIAGNOSIS — S0990XA Unspecified injury of head, initial encounter: Secondary | ICD-10-CM

## 2019-12-14 DIAGNOSIS — J45909 Unspecified asthma, uncomplicated: Secondary | ICD-10-CM | POA: Diagnosis not present

## 2019-12-14 DIAGNOSIS — W19XXXA Unspecified fall, initial encounter: Secondary | ICD-10-CM | POA: Insufficient documentation

## 2019-12-14 DIAGNOSIS — R55 Syncope and collapse: Secondary | ICD-10-CM | POA: Diagnosis not present

## 2019-12-14 LAB — CBC WITH DIFFERENTIAL/PLATELET
Abs Immature Granulocytes: 0.02 10*3/uL (ref 0.00–0.07)
Basophils Absolute: 0.1 10*3/uL (ref 0.0–0.1)
Basophils Relative: 1 %
Eosinophils Absolute: 0.8 10*3/uL — ABNORMAL HIGH (ref 0.0–0.5)
Eosinophils Relative: 12 %
HCT: 42.1 % (ref 36.0–46.0)
Hemoglobin: 13.1 g/dL (ref 12.0–15.0)
Immature Granulocytes: 0 %
Lymphocytes Relative: 23 %
Lymphs Abs: 1.5 10*3/uL (ref 0.7–4.0)
MCH: 29 pg (ref 26.0–34.0)
MCHC: 31.1 g/dL (ref 30.0–36.0)
MCV: 93.1 fL (ref 80.0–100.0)
Monocytes Absolute: 0.4 10*3/uL (ref 0.1–1.0)
Monocytes Relative: 7 %
Neutro Abs: 3.8 10*3/uL (ref 1.7–7.7)
Neutrophils Relative %: 57 %
Platelets: 325 10*3/uL (ref 150–400)
RBC: 4.52 MIL/uL (ref 3.87–5.11)
RDW: 14.4 % (ref 11.5–15.5)
WBC: 6.6 10*3/uL (ref 4.0–10.5)
nRBC: 0 % (ref 0.0–0.2)

## 2019-12-14 LAB — COMPREHENSIVE METABOLIC PANEL
ALT: 16 U/L (ref 0–44)
AST: 18 U/L (ref 15–41)
Albumin: 3.7 g/dL (ref 3.5–5.0)
Alkaline Phosphatase: 39 U/L (ref 38–126)
Anion gap: 9 (ref 5–15)
BUN: 12 mg/dL (ref 6–20)
CO2: 26 mmol/L (ref 22–32)
Calcium: 8.8 mg/dL — ABNORMAL LOW (ref 8.9–10.3)
Chloride: 103 mmol/L (ref 98–111)
Creatinine, Ser: 0.93 mg/dL (ref 0.44–1.00)
GFR calc Af Amer: >60 mL/min (ref >60)
GFR calc non Af Amer: >60 mL/min (ref >60)
Glucose, Bld: 103 mg/dL — ABNORMAL HIGH (ref 70–99)
Potassium: 4.4 mmol/L (ref 3.5–5.1)
Sodium: 138 mmol/L (ref 135–145)
Total Bilirubin: 0.6 mg/dL (ref 0.3–1.2)
Total Protein: 7.2 g/dL (ref 6.5–8.1)

## 2019-12-14 LAB — I-STAT BETA HCG BLOOD, ED (MC, WL, AP ONLY): I-stat hCG, quantitative: <5 m[IU]/mL (ref <5)

## 2019-12-14 NOTE — ED Provider Notes (Signed)
Emergency Department Provider Note   I have reviewed the triage vital signs and the nursing notes.   HISTORY  Chief Complaint Fall   HPI Teresa Peterson is a 33 y.o. female with PMH of asthma presents to the ED after 3 episodes of syncope 3 days prior.  Patient states that she had multiple episodes of passing out on Sunday morning.  She fell in her home after feeling lightheaded especially with standing.  She struck her head during one of the falls and has had persistent headache since that time.  She denies any fevers but has had some sore throat and mild shortness of breath starting in the past couple of days.  She did have a COVID-19 contact at work but has not been tested herself.  She denies significant congestion.  She states that her sore throat is moderate in severity and has occurred in the past with tonsillitis but is not responding to over-the-counter medications.  She denies chest pain.  Denies having chest pain, palpitations, shortness of breath prior to passing out.  No vomiting or confusion.  No radiation of symptoms or other modifying factors.    Past Medical History:  Diagnosis Date  . Asthma   . Emphysema of lung ([place])   . Headache(784.0)    Migraines  . Heart murmur   . Noncompliance with medications     Patient Active Problem List   Diagnosis Date Noted  . Chest pain at rest 11/29/2011  . SOB (shortness of breath) 11/29/2011  . Chest pain 11/29/2011  . Asthma exacerbation 11/08/2011  . Tinea versicolor 11/08/2011  . Cough 11/08/2011    History reviewed. No pertinent surgical history.  Allergies Sudafed [pseudoephedrine hcl]  Family History  Problem Relation Age of Onset  . Alcohol abuse Other        grandparents  . Arthritis Other        grandparent  . Cancer Other        relative  . Stroke Other        grandparent  . Kidney disease Other        grandparents  . Diabetes Other        grandparent  . Asthma Brother     Social  History Social History   Tobacco Use  . Smoking status: Current Every Day Smoker    Packs/day: 0.50    Types: Cigarettes    Last attempt to quit: 07/08/2011    Years since quitting: 8.4  . Smokeless tobacco: Never Used  Substance Use Topics  . Alcohol use: Yes    Comment: drink 1 bottle wine/day  . Drug use: No    Comment: quit 06/2011    Review of Systems  Constitutional: No fever/chills Eyes: No visual changes. ENT: Positive sore throat. Cardiovascular: Denies chest pain. Positive syncope x 3.  Respiratory: Mild shortness of breath for the last 2 days.  Gastrointestinal: Denies abdominal pain.  No nausea, no vomiting.  No diarrhea.  No constipation. Genitourinary: Negative for dysuria. Musculoskeletal: Negative for back pain. Skin: Negative for rash. Neurological: Negative for focal weakness or numbness. Positive HA.   10-point ROS otherwise negative.  ____________________________________________   PHYSICAL EXAM:  VITAL SIGNS: ED Triage Vitals  Enc Vitals Group     BP 12/14/19 1137 (!) 150/94     Pulse Rate 12/14/19 1137 81     Resp 12/14/19 1137 16     Temp 12/14/19 1137 98.9 F (37.2 C)     Temp  Source 12/14/19 1137 Oral     SpO2 12/14/19 1137 98 %     Weight 12/14/19 1135 200 lb (90.7 kg)     Height 12/14/19 1135 5\' 3"  (1.6 m)   Constitutional: Alert and oriented. Well appearing and in no acute distress. Eyes: Conjunctivae are normal. Head: Atraumatic. Nose: No congestion/rhinnorhea. Mouth/Throat: Mucous membranes are moist.  Neck: No stridor. No cervical spine tenderness to palpation. Cardiovascular: Normal rate, regular rhythm. Good peripheral circulation. Grossly normal heart sounds.   Respiratory: Normal respiratory effort.  No retractions. Lungs CTAB. Gastrointestinal: Soft and nontender. No distention.  Musculoskeletal: No gross deformities of extremities. Neurologic:  Normal speech and language.  Skin:  Skin is warm, dry and intact. No rash  noted.  ____________________________________________   LABS (all labs ordered are listed, but only abnormal results are displayed)  Labs Reviewed  COMPREHENSIVE METABOLIC PANEL - Abnormal; Notable for the following components:      Result Value   Glucose, Bld 103 (*)    Calcium 8.8 (*)    All other components within normal limits  CBC WITH DIFFERENTIAL/PLATELET - Abnormal; Notable for the following components:   Eosinophils Absolute 0.8 (*)    All other components within normal limits  NOVEL CORONAVIRUS, NAA (HOSP ORDER, SEND-OUT TO REF LAB; TAT 18-24 HRS)  I-STAT BETA HCG BLOOD, ED ([facility], [facility], [facility] ONLY)   ____________________________________________  EKG   EKG Interpretation  Date/Time:  Tuesday December 14 2019 12:13:24 EST Ventricular Rate:  68 PR Interval:    QRS Duration: 85 QT Interval:  376 QTC Calculation: 400 R Axis:   102 Text Interpretation: Sinus rhythm Borderline right axis deviation Baseline wander in lead(s) V1 No STEMI Confirmed by [id number] [phone]) on 12/14/2019 12:16:40 PM       ____________________________________________  RADIOLOGY  Imaging reviewed.  ____________________________________________   PROCEDURES  Procedure(s) performed:   Procedures  None ____________________________________________   INITIAL IMPRESSION / ASSESSMENT AND PLAN / ED COURSE  Pertinent labs & imaging results that were available during my care of the patient were reviewed by me and considered in my medical decision making (see chart for details).   Patient presents to the emergency department for evaluation of syncope x3 2 days ago.  She struck her head and has had persistent headache.  Suspect concussion type symptoms clinically with persistent headache.  No clear neuro deficits.  Patient does describe some vague constitutional symptoms such as mild dyspnea, sore throat, known COVID-19 contact.  Plan for testing and quarantine until results come back in 1 to 2  days.  Will obtain screening blood work with syncope evaluation along with EKG. CXR with SOB symptoms and CT head given injury and persistent HA symptoms.   CT and CXR reviewed. No acute findings. Plan for supportive care at home and PCP follow up.   I reviewed all nursing notes, vitals, pertinent old records, EKGs, labs, imaging (as available).  ____________________________________________  FINAL CLINICAL IMPRESSION(S) / ED DIAGNOSES  Final diagnoses:  Syncope, unspecified syncope type  Injury of head, initial encounter  SOB (shortness of breath)     Note:  This document was prepared using Dragon voice recognition software and may include unintentional dictation errors.  12/16/2019, MD, [facility] Emergency Medicine    Derald Lorge, [facility], MD 12/16/19 [phone]

## 2019-12-14 NOTE — ED Triage Notes (Signed)
On Sunday, pt fell. States she felt like her blood sugar dropped. She hit her head and felt like she passed out. Complaining of a headache. Borderline diabetic.

## 2019-12-14 NOTE — Discharge Instructions (Signed)
You have been seen today in the Emergency Department (ED)  for syncope (passing out).  Your workup including labs and EKG show reassuring results.  Your symptoms may be due to dehydration, so it is important that you drink plenty of non-alcoholic fluids.  I am testing you for COVID 19. Stay isolated at home for the next 7 days. You can follow your test results in the MyChart app.   Please call your regular doctor as soon as possible to schedule the next available clinic appointment to follow up with him/her regarding your visit to the ED and your symptoms.  Return to the Emergency Department (ED)  if you have any further syncopal episodes (pass out again) or develop ANY chest pain, pressure, tightness, trouble breathing, sudden sweating, or other symptoms that concern you.

## 2019-12-15 LAB — NOVEL CORONAVIRUS, NAA (HOSP ORDER, SEND-OUT TO REF LAB; TAT 18-24 HRS): SARS-CoV-2, NAA: NOT DETECTED

## 2020-08-21 ENCOUNTER — Other Ambulatory Visit: Payer: Self-pay

## 2020-08-21 ENCOUNTER — Encounter: Payer: Self-pay | Admitting: Emergency Medicine

## 2020-08-21 ENCOUNTER — Ambulatory Visit
Admission: EM | Admit: 2020-08-21 | Discharge: 2020-08-21 | Disposition: A | Discharge status: Home / Self Care | Payer: Medicaid Other | Attending: Emergency Medicine | Admitting: Emergency Medicine

## 2020-08-21 DIAGNOSIS — Z113 Encounter for screening for infections with a predominantly sexual mode of transmission: Secondary | ICD-10-CM | POA: Diagnosis present

## 2020-08-21 DIAGNOSIS — N898 Other specified noninflammatory disorders of vagina: Secondary | ICD-10-CM | POA: Insufficient documentation

## 2020-08-21 LAB — POCT URINALYSIS DIP (MANUAL ENTRY)
Bilirubin, UA: NEGATIVE
Glucose, UA: NEGATIVE mg/dL
Ketones, POC UA: NEGATIVE mg/dL
Leukocytes, UA: NEGATIVE
Nitrite, UA: NEGATIVE
Spec Grav, UA: 1.025 (ref 1.010–1.025)
Urobilinogen, UA: 0.2 E.U./dL
pH, UA: 6 (ref 5.0–8.0)

## 2020-08-21 LAB — POCT URINE PREGNANCY: Preg Test, Ur: NEGATIVE

## 2020-08-21 MED ORDER — CEFTRIAXONE SODIUM 500 MG IJ SOLR
500.0000 mg | Freq: Once | INTRAMUSCULAR | Status: AC
  Administered 2020-08-21: 500 mg via INTRAMUSCULAR

## 2020-08-21 MED ORDER — AZITHROMYCIN 500 MG PO TABS
1000.0000 mg | ORAL_TABLET | Freq: Once | ORAL | Status: AC
  Administered 2020-08-21: 1000 mg via ORAL

## 2020-08-21 MED ORDER — METRONIDAZOLE 500 MG PO TABS: 500.0000 mg | ORAL_TABLET | Freq: Two times a day (BID) | ORAL | 0 refills | Status: DC

## 2020-08-21 NOTE — ED Provider Notes (Addendum)
[facility]   [id number] 08/21/20 Arrival Time: 0900   ME:QASTMHD DISCHARGE  SUBJECTIVE:  Teresa Peterson is a 34 y.o. female who presents with complaints of vaginal discharge, spotting, vaginal irritation, and discomfort x 1 week.  Started menstrual cycle 1 week ago.  Patient has had two sexual partners over the past 6 months.  Last sexual partner was unfaithful.  Concerned for STDs and would like treatment.  Describes discharge as thick and white.  Denies alleviating factors. Worse with urination.  She reports similar symptoms in the past and was diagnosed with chlamydia and BV.  Complains of associated fishy vaginal odor.  She denies fever, chills, nausea, vomiting, abdominal or pelvic pain, urinary symptoms, vaginal itching, dyspareunia, vaginal rashes or lesions.   Patient's last menstrual period was 08/03/2020.  ROS: As per HPI.  All other pertinent ROS negative.     Past Medical History:  Diagnosis Date  . Asthma   . Emphysema of lung ([facility])   . Headache(784.0)    Migraines  . Heart murmur   . Noncompliance with medications    History reviewed. No pertinent surgical history. Allergies  Allergen Reactions  . Sudafed [Pseudoephedrine Hcl] Itching   No current facility-administered medications on file prior to encounter.   Current Outpatient Medications on File Prior to Encounter  Medication Sig Dispense Refill  . budesonide-formoterol (SYMBICORT) 160-4.5 MCG/ACT inhaler Inhale 2 puffs into the lungs 2 (two) times daily. (Patient taking differently: Inhale 1 puff into the lungs 2 (two) times daily. ) 1 Inhaler 12  . norethindrone (MICRONOR) 0.35 MG tablet Take 1 tablet by mouth daily.      Social History   Socioeconomic History  . Marital status: Single    Spouse name: Not on file  . Number of children: Not on file  . Years of education: Not on file  . Highest education level: Not on file  Occupational History  . Not on file  Tobacco Use  . Smoking  status: Current Every Day Smoker    Packs/day: 0.50    Types: Cigarettes    Last attempt to quit: 07/08/2011    Years since quitting: 9.1  . Smokeless tobacco: Never Used  Vaping Use  . Vaping Use: Never used  Substance and Sexual Activity  . Alcohol use: Yes    Comment: drink 1 bottle wine/day  . Drug use: No    Comment: quit 06/2011  . Sexual activity: Yes    Birth control/protection: None  Other Topics Concern  . Not on file  Social History Narrative   Alcoholic beverage: Yes      Drug use: No stopped 06/2011      Seatbeat Use: yes      Firearms in home: no      Exercise: no      Smoke Alarm in your home: Yes                  Social Determinants of Health   Financial Resource Strain:   . Difficulty of Paying Living Expenses: Not on file  Food Insecurity:   . Worried About Programme researcher, broadcasting/film/video in the Last Year: Not on file  . Ran Out of Food in the Last Year: Not on file  Transportation Needs:   . Lack of Transportation (Medical): Not on file  . Lack of Transportation (Non-Medical): Not on file  Physical Activity:   . Days of Exercise per Week: Not on file  . Minutes of Exercise per  Session: Not on file  Stress:   . Feeling of Stress : Not on file  Social Connections:   . Frequency of Communication with Friends and Family: Not on file  . Frequency of Social Gatherings with Friends and Family: Not on file  . Attends Religious Services: Not on file  . Active Member of Clubs or Organizations: Not on file  . Attends Banker Meetings: Not on file  . Marital Status: Not on file  Intimate Partner Violence:   . Fear of Current or Ex-Partner: Not on file  . Emotionally Abused: Not on file  . Physically Abused: Not on file  . Sexually Abused: Not on file   Family History  Problem Relation Age of Onset  . Alcohol abuse Other        grandparents  . Arthritis Other        grandparent  . Cancer Other        relative  . Stroke Other         grandparent  . Kidney disease Other        grandparents  . Diabetes Other        grandparent  . Asthma Brother     OBJECTIVE:  Vitals:   08/21/20 0927 08/21/20 0931  BP:  (!) 144/92  Pulse:  64  Resp:  18  Temp:  98.5 F (36.9 C)  TempSrc:  Oral  SpO2:  98%  Weight: 206 lb (93.4 kg)   Height: 5\' 3"  (1.6 m)     General appearance: Alert, NAD, appears stated age Head: NCAT Throat: lips, mucosa, and tongue normal; teeth and gums normal Lungs: CTA bilaterally without adventitious breath sounds Heart: regular rate and rhythm.   Abdomen: soft, non-tender; bowel sounds normal; no masses or organomegaly GU: External examination without vulvar lesions or erythema Skin: warm and dry Psychological:  Alert and cooperative. Normal mood and affect.  LABS:  Results for orders placed or performed during the hospital encounter of 08/21/20 (from the past 24 hour(s))  POCT urinalysis dipstick     Status: Abnormal   Collection Time: 08/21/20  9:38 AM  Result Value Ref Range   Color, UA yellow yellow   Clarity, UA clear clear   Glucose, UA negative negative mg/dL   Bilirubin, UA negative negative   Ketones, POC UA negative negative mg/dL   Spec Grav, UA 08/23/20 1.610 - 1.025   Blood, UA trace-intact (A) negative   pH, UA 6.0 5.0 - 8.0   Protein Ur, POC trace (A) negative mg/dL   Urobilinogen, UA 0.2 0.2 or 1.0 E.U./dL   Nitrite, UA Negative Negative   Leukocytes, UA Negative Negative  POCT urine pregnancy     Status: None   Collection Time: 08/21/20  9:38 AM  Result Value Ref Range   Preg Test, Ur Negative Negative     ASSESSMENT & PLAN:  1. Vaginal discharge   2. Screening examination for venereal disease     Meds ordered this encounter  Medications  . metroNIDAZOLE (FLAGYL) 500 MG tablet    Sig: Take 1 tablet (500 mg total) by mouth 2 (two) times daily.    Dispense:  14 tablet    Refill:  0    Order Specific Question:   Supervising Provider    Answer:   08/23/20 Eustace Moore  . cefTRIAXone (ROCEPHIN) injection 500 mg  . azithromycin (ZITHROMAX) tablet 1,000 mg    Pending: Labs Reviewed  POCT URINALYSIS DIP (MANUAL ENTRY)  POCT URINE PREGNANCY  CERVICOVAGINAL ANCILLARY ONLY   Vaginal self-swab obtained.  We will follow up with you regarding abnormal results Given rocephin 500 mg injection and azithromycin 1g in office HIV/ syphilis testing today Prescribed metronidazole 500 mg twice daily for 7 days (do not take while consuming alcohol and/or if breastfeeding) Take medications as prescribed and to completion If tests results are positive, please abstain from sexual activity until you and your partner(s) have been treated Follow up with PCP as needed Return here or go to ER if you have any new or worsening symptoms fever, chills, nausea, vomiting, abdominal or pelvic pain, painful intercourse, vaginal discharge, vaginal bleeding, persistent symptoms despite treatment, etc...  Reviewed expectations re: course of current medical issues. Questions answered. Outlined signs and symptoms indicating need for more acute intervention. Patient verbalized understanding. After Visit Summary given.       Rennis Harding, PA-C 08/21/20 [id number]    Rennis Harding, PA-C 08/21/20 1017

## 2020-08-21 NOTE — Discharge Instructions (Signed)
Vaginal self-swab obtained.  We will follow up with you regarding abnormal results Given rocephin 500 mg injection and azithromycin 1g in office HIV/ syphilis testing today Prescribed metronidazole 500 mg twice daily for 7 days (do not take while consuming alcohol and/or if breastfeeding) Take medications as prescribed and to completion If tests results are positive, please abstain from sexual activity until you and your partner(s) have been treated Follow up with PCP as needed Return here or go to ER if you have any new or worsening symptoms fever, chills, nausea, vomiting, abdominal or pelvic pain, painful intercourse, vaginal discharge, vaginal bleeding, persistent symptoms despite treatment, etc..Marland Kitchen

## 2020-08-21 NOTE — ED Notes (Signed)
Pt unable to leave urine sample; provider aware.

## 2020-08-21 NOTE — ED Triage Notes (Signed)
Vaginal discharge, spotting, discomfort and burning that started about a week ago.

## 2020-08-22 LAB — CERVICOVAGINAL ANCILLARY ONLY
Bacterial Vaginitis (gardnerella): POSITIVE — AB
Candida Glabrata: NEGATIVE
Candida Vaginitis: NEGATIVE
Chlamydia: NEGATIVE
Comment: NEGATIVE
Comment: NEGATIVE
Comment: NEGATIVE
Comment: NEGATIVE
Comment: NEGATIVE
Comment: NORMAL
Neisseria Gonorrhea: NEGATIVE
Trichomonas: NEGATIVE

## 2020-08-22 LAB — HIV ANTIBODY (ROUTINE TESTING W REFLEX): HIV Screen 4th Generation wRfx: NONREACTIVE

## 2020-08-22 LAB — RPR: RPR Ser Ql: NONREACTIVE

## 2020-10-05 IMAGING — CT CT HEAD W/O CM
3 series · 16 of 47 positions shown, 19 images · non-contrast
Comparison: None.

CLINICAL DATA: Fall several days prior. Headache. Questionable loss
of consciousness at time fall

EXAM:
CT HEAD WITHOUT CONTRAST
TECHNIQUE: Contiguous axial images were obtained from the base of the skull
through the vertex without intravenous contrast.

[Series 2: head w o · axial · 0.41mm/px · z∈[+1134,+1274]mm · 10 of 34 slices shown, 13 images]
[im 3/34  brain]
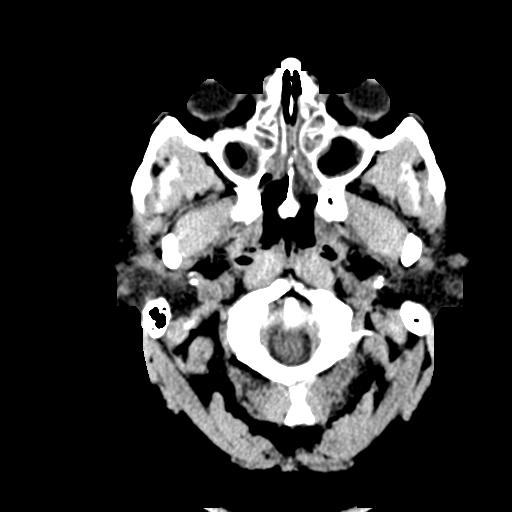
[im 3/34  bone]
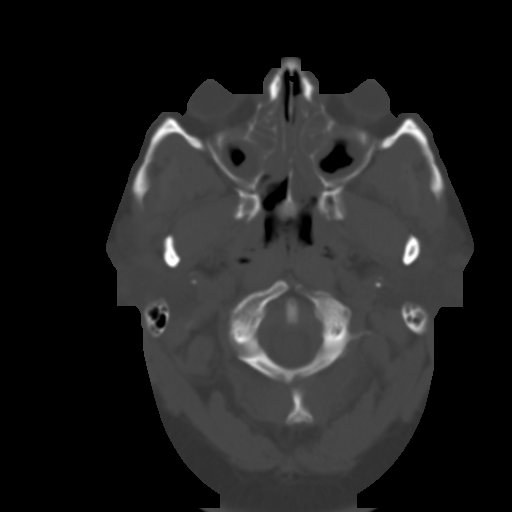
[im 6/34  brain]
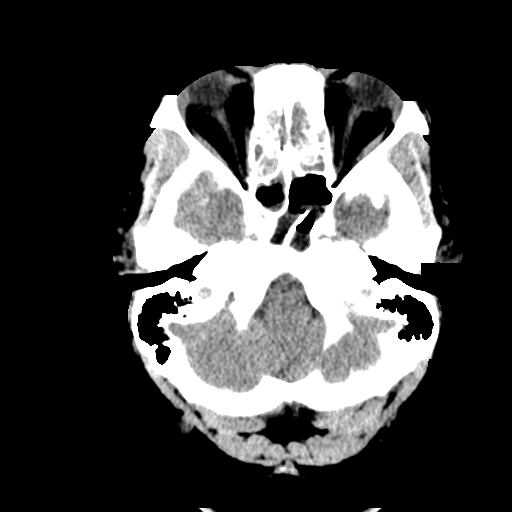
[im 10/34  brain]
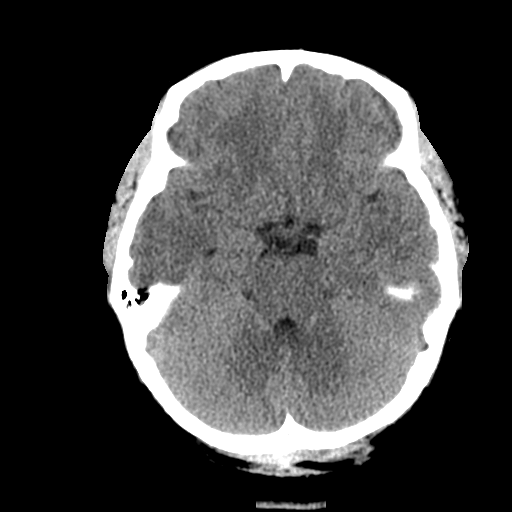
[im 12/34  brain]
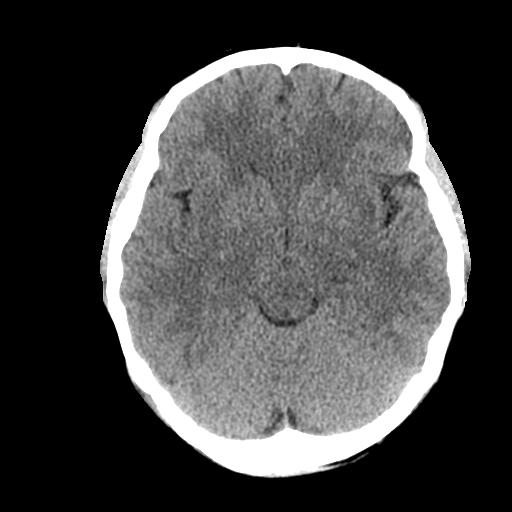
[im 15/34  brain]
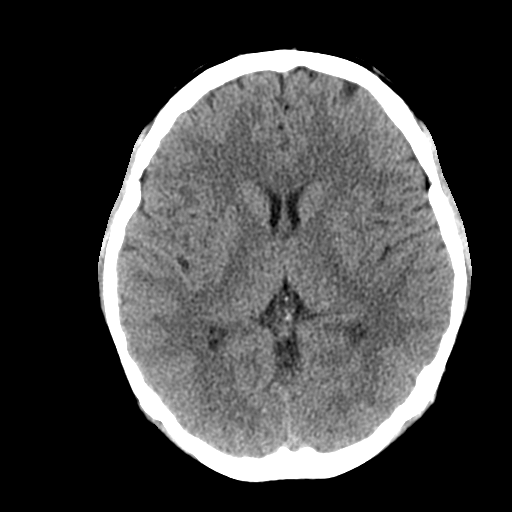
[im 15/34  bone]
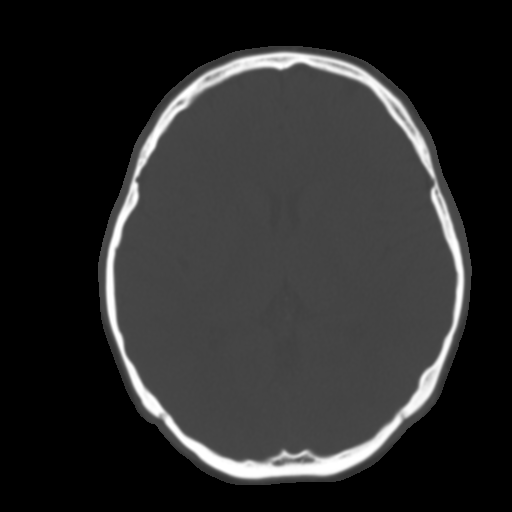
[im 19/34  brain]
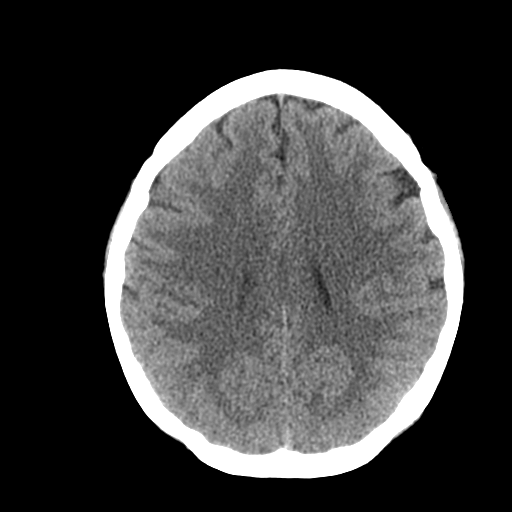
[im 22/34  brain]
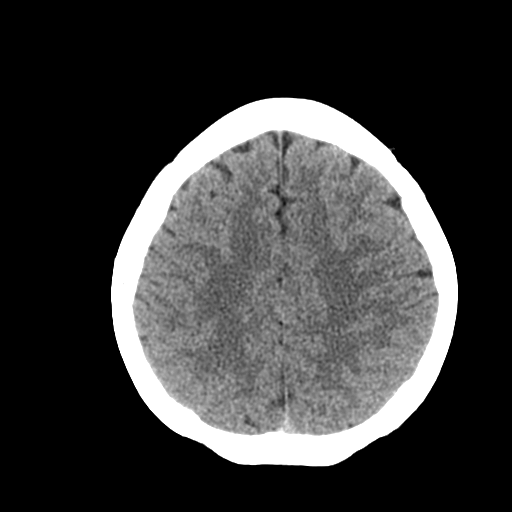
[im 26/34  brain]
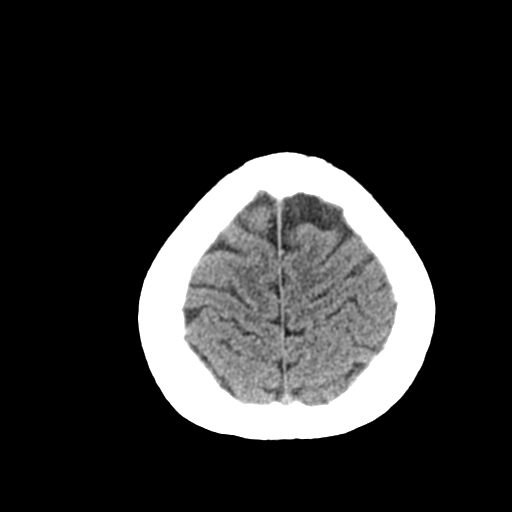
[im 28/34  brain]
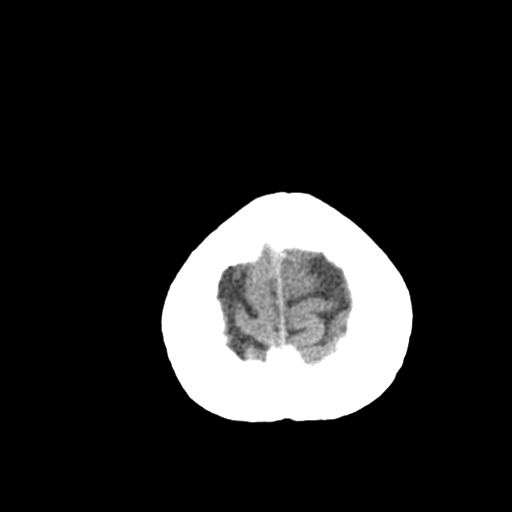
[im 28/34  bone]
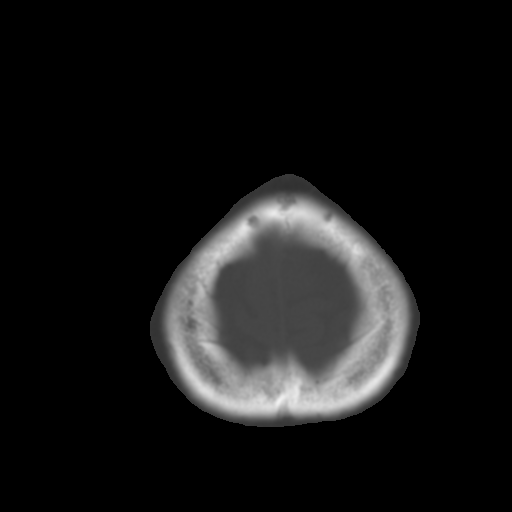
[im 31/34  brain]
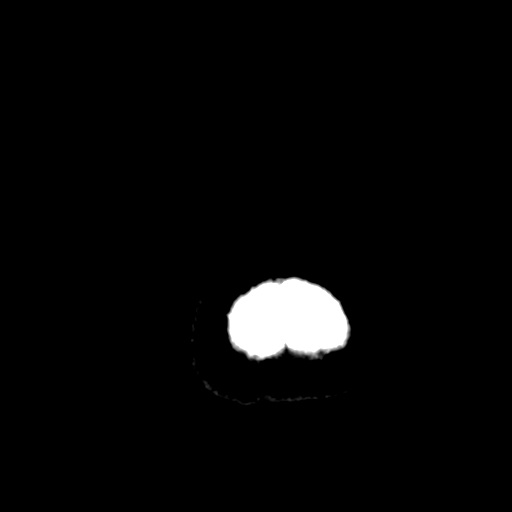

[Series 4: coronal soft · coronal · 0.35mm/px · 3 of 67 slices shown]
[im 23/67  brain]
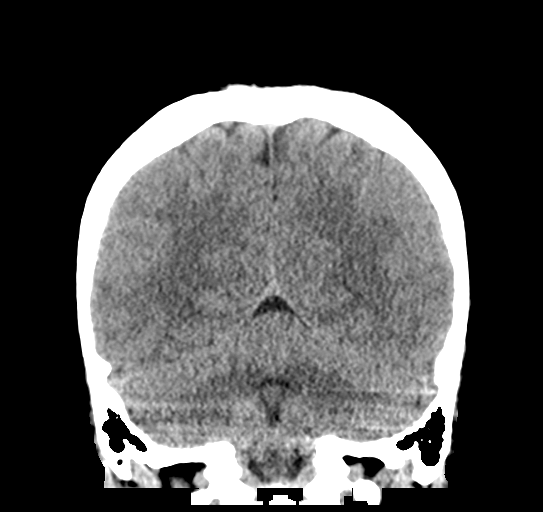
[im 30/67  brain]
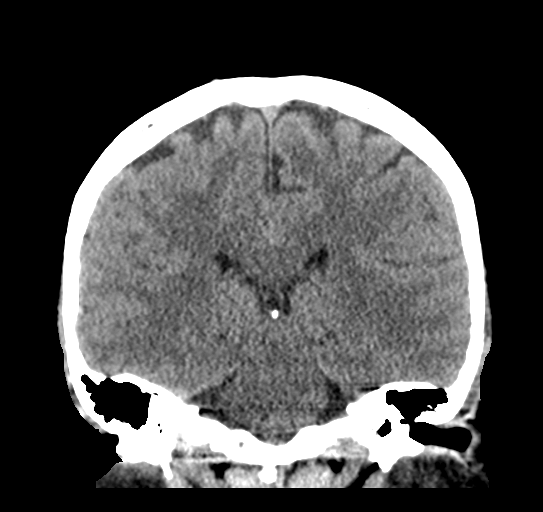
[im 37/67  brain]
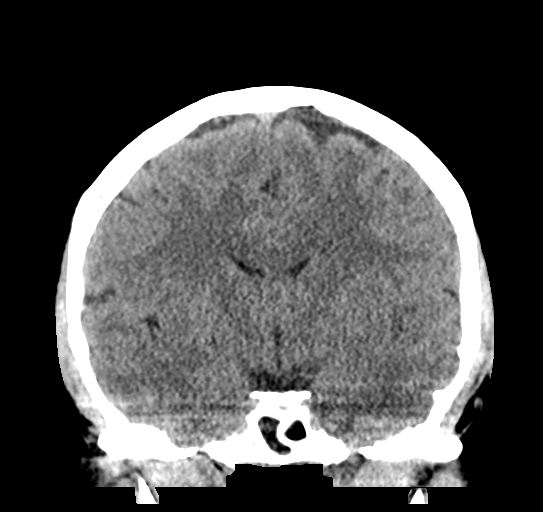

[Series 5: sagittal soft · sagittal · 0.33mm/px · 3 of 57 slices shown]
[im 19/57  brain]
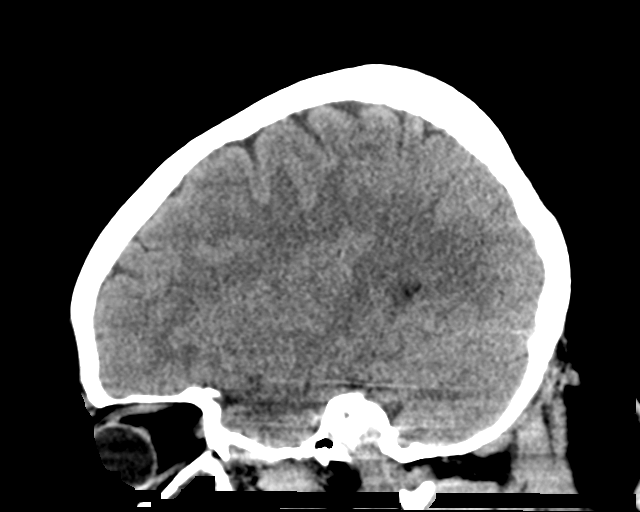
[im 29/57  brain]
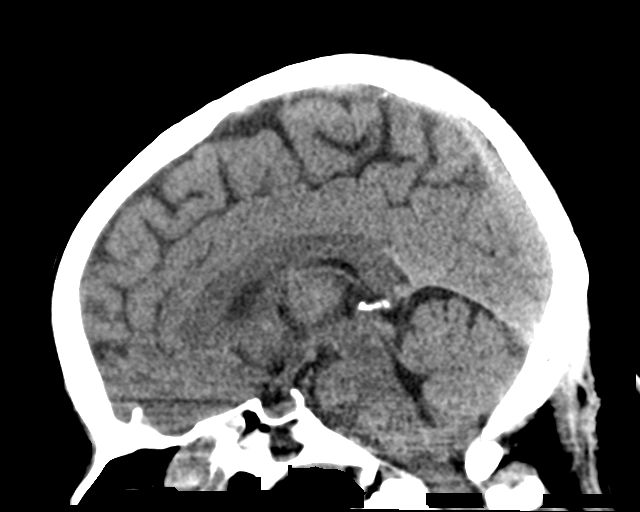
[im 38/57  brain]
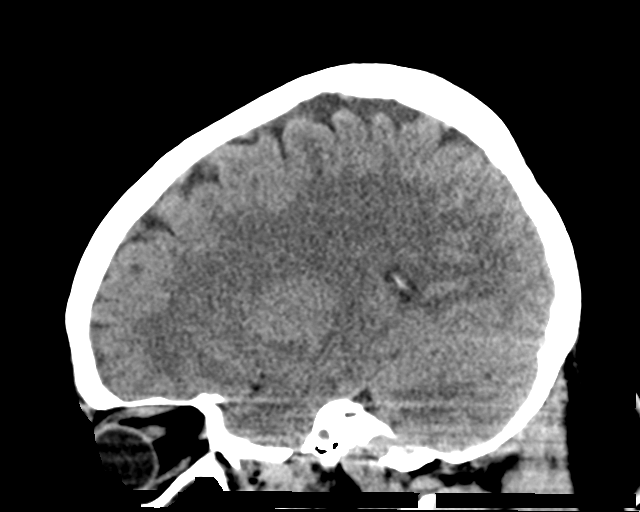

[16 of 47 positions shown; findings below may reference images not displayed]

FINDINGS: Brain: Ventricles are normal in size and configuration. There is no
intracranial mass, hemorrhage, extra-axial fluid collection, or
midline shift. Brain parenchyma appears unremarkable. No acute
infarct evident.

Vascular: There is no hyperdense vessel. There is no appreciable
vascular calcification.

Skull: Bony calvarium appears intact.

Sinuses/Orbits: There is extensive opacification throughout the
maxillary antra bilaterally. There is opacification of virtually all
ethmoid air cells as well as diffuse opacification throughout the
sphenoid sinus regions. Frontal sinuses are aplastic. Orbits appear
symmetric bilaterally.

Other: Mastoid air cells are clear.
IMPRESSION: Extensive multifocal paranasal sinus disease. Study otherwise
unremarkable.

## 2020-10-05 IMAGING — DX DG CHEST 1V PORT
1 series · 1 of 1 positions shown · non-contrast
Comparison: April 20, 2018

CLINICAL DATA: Shortness of breath.  Recent fall

EXAM:
PORTABLE CHEST 1 VIEW

[chest ap]
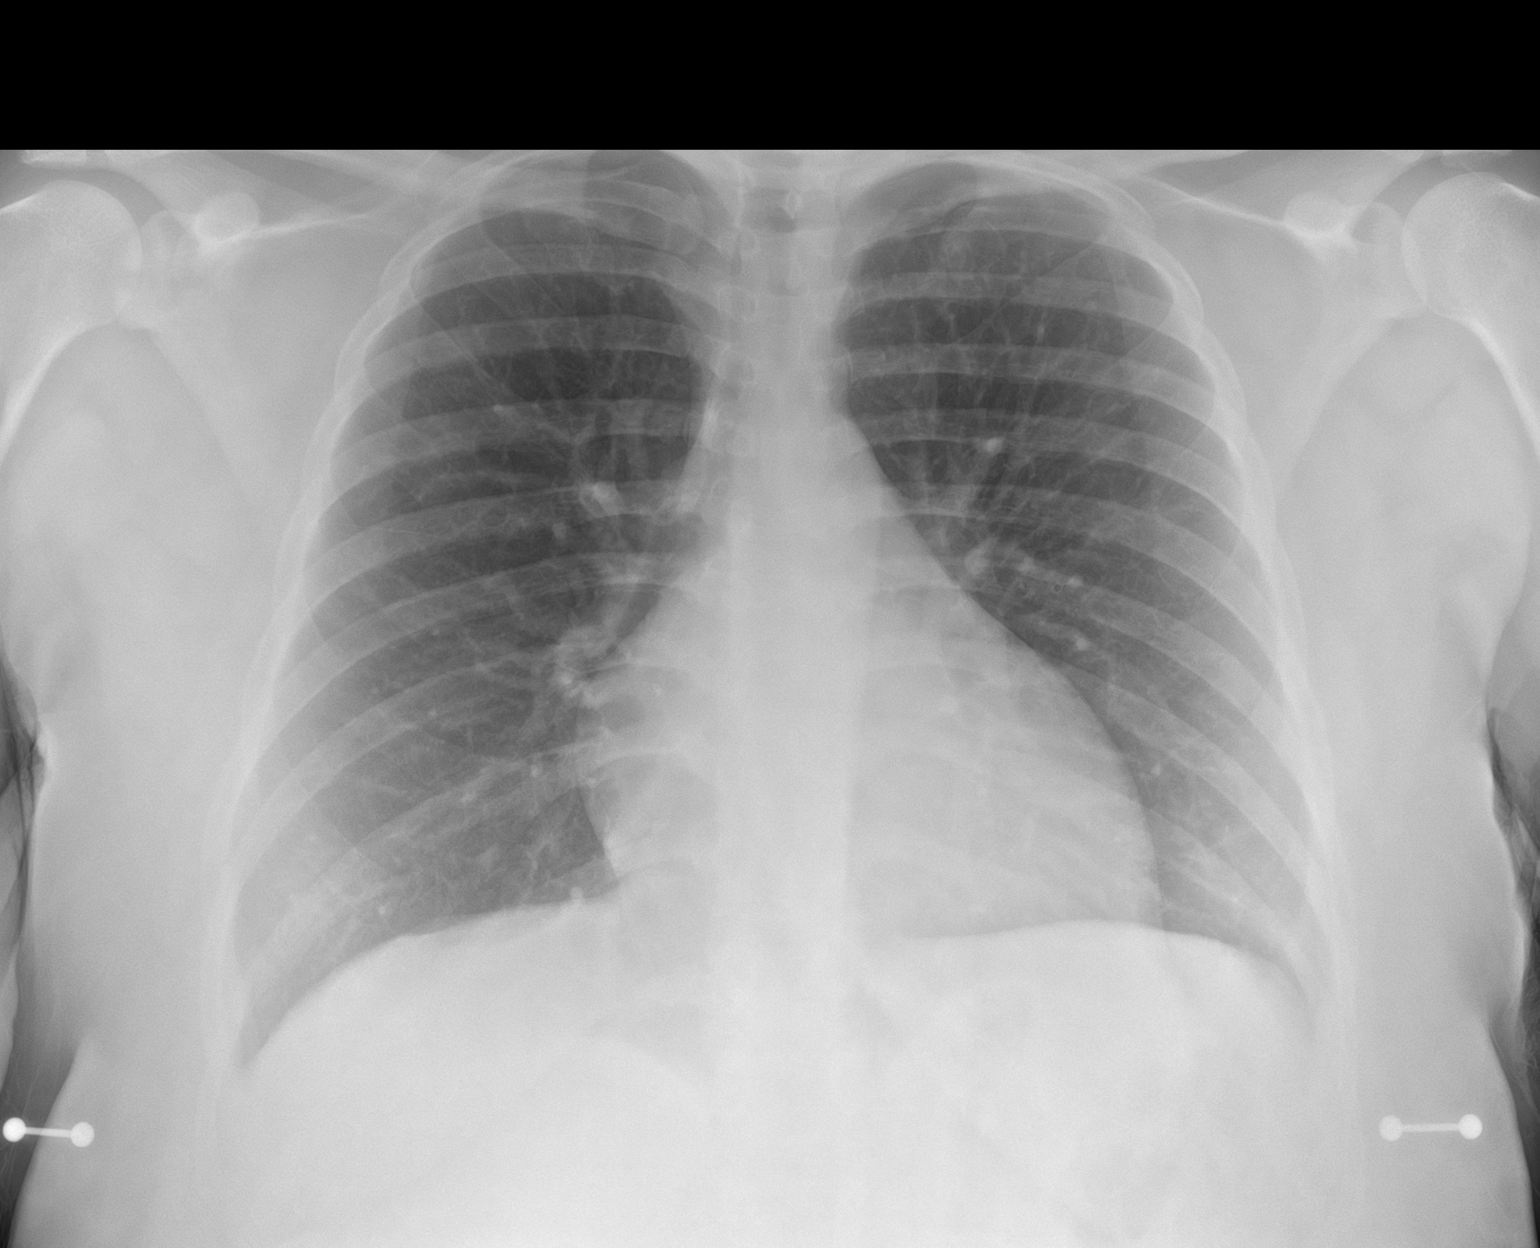

[1 of 1 positions shown; findings below may reference images not displayed]

FINDINGS: Lungs are clear. Heart size and pulmonary vascularity are normal. No
adenopathy. No pneumothorax. No bone lesions.
IMPRESSION: No edema or consolidation.

## 2021-02-14 ENCOUNTER — Emergency Department (HOSPITAL_BASED_OUTPATIENT_CLINIC_OR_DEPARTMENT_OTHER)
Admission: EM | Admit: 2021-02-14 | Discharge: 2021-02-15 | Disposition: A | Discharge status: Home / Self Care | Payer: Medicaid Other | Attending: Emergency Medicine | Admitting: Emergency Medicine

## 2021-02-14 ENCOUNTER — Other Ambulatory Visit: Payer: Self-pay

## 2021-02-14 ENCOUNTER — Emergency Department (HOSPITAL_BASED_OUTPATIENT_CLINIC_OR_DEPARTMENT_OTHER): Payer: Medicaid Other

## 2021-02-14 ENCOUNTER — Encounter (HOSPITAL_BASED_OUTPATIENT_CLINIC_OR_DEPARTMENT_OTHER): Payer: Self-pay

## 2021-02-14 DIAGNOSIS — J45901 Unspecified asthma with (acute) exacerbation: Secondary | ICD-10-CM | POA: Diagnosis not present

## 2021-02-14 DIAGNOSIS — R0789 Other chest pain: Secondary | ICD-10-CM | POA: Diagnosis present

## 2021-02-14 DIAGNOSIS — R110 Nausea: Secondary | ICD-10-CM | POA: Diagnosis not present

## 2021-02-14 DIAGNOSIS — Z20822 Contact with and (suspected) exposure to covid-19: Secondary | ICD-10-CM | POA: Diagnosis not present

## 2021-02-14 DIAGNOSIS — F17210 Nicotine dependence, cigarettes, uncomplicated: Secondary | ICD-10-CM | POA: Insufficient documentation

## 2021-02-14 DIAGNOSIS — R079 Chest pain, unspecified: Secondary | ICD-10-CM

## 2021-02-14 LAB — COMPREHENSIVE METABOLIC PANEL
ALT: 14 U/L (ref 0–44)
AST: 20 U/L (ref 15–41)
Albumin: 4 g/dL (ref 3.5–5.0)
Alkaline Phosphatase: 42 U/L (ref 38–126)
Anion gap: 9 (ref 5–15)
BUN: 11 mg/dL (ref 6–20)
CO2: 22 mmol/L (ref 22–32)
Calcium: 9 mg/dL (ref 8.9–10.3)
Chloride: 105 mmol/L (ref 98–111)
Creatinine, Ser: 0.76 mg/dL (ref 0.44–1.00)
GFR, Estimated: >60 mL/min (ref >60)
Glucose, Bld: 89 mg/dL (ref 70–99)
Potassium: 3.9 mmol/L (ref 3.5–5.1)
Sodium: 136 mmol/L (ref 135–145)
Total Bilirubin: 0.3 mg/dL (ref 0.3–1.2)
Total Protein: 7.3 g/dL (ref 6.5–8.1)

## 2021-02-14 LAB — CBC WITH DIFFERENTIAL/PLATELET
Abs Immature Granulocytes: 0.02 10*3/uL (ref 0.00–0.07)
Basophils Absolute: 0.1 10*3/uL (ref 0.0–0.1)
Basophils Relative: 1 %
Eosinophils Absolute: 0.7 10*3/uL — ABNORMAL HIGH (ref 0.0–0.5)
Eosinophils Relative: 8 %
HCT: 39.7 % (ref 36.0–46.0)
Hemoglobin: 12.7 g/dL (ref 12.0–15.0)
Immature Granulocytes: 0 %
Lymphocytes Relative: 37 %
Lymphs Abs: 3 10*3/uL (ref 0.7–4.0)
MCH: 28.6 pg (ref 26.0–34.0)
MCHC: 32 g/dL (ref 30.0–36.0)
MCV: 89.4 fL (ref 80.0–100.0)
Monocytes Absolute: 0.5 10*3/uL (ref 0.1–1.0)
Monocytes Relative: 6 %
Neutro Abs: 3.9 10*3/uL (ref 1.7–7.7)
Neutrophils Relative %: 48 %
Platelets: 308 10*3/uL (ref 150–400)
RBC: 4.44 MIL/uL (ref 3.87–5.11)
RDW: 13.5 % (ref 11.5–15.5)
WBC: 8.1 10*3/uL (ref 4.0–10.5)
nRBC: 0 % (ref 0.0–0.2)

## 2021-02-14 LAB — TROPONIN I (HIGH SENSITIVITY): Troponin I (High Sensitivity): 3 ng/L (ref <18)

## 2021-02-14 LAB — LIPASE, BLOOD: Lipase: 39 U/L (ref 11–51)

## 2021-02-14 LAB — PREGNANCY, URINE: Preg Test, Ur: NEGATIVE

## 2021-02-14 MED ORDER — IBUPROFEN 600 MG PO TABS: 600.0000 mg | ORAL_TABLET | Freq: Four times a day (QID) | ORAL | 0 refills | Status: DC | PRN

## 2021-02-14 MED ORDER — BENZONATATE 100 MG PO CAPS: 100.0000 mg | ORAL_CAPSULE | Freq: Three times a day (TID) | ORAL | 0 refills | Status: DC

## 2021-02-14 MED ORDER — ONDANSETRON 4 MG PO TBDP: 4.0000 mg | ORAL_TABLET | Freq: Three times a day (TID) | ORAL | 0 refills | Status: DC | PRN

## 2021-02-14 NOTE — ED Triage Notes (Signed)
Pt chest tightness x 2 days-reports flu like sx x 8 days-NAD-steady gait

## 2021-02-14 NOTE — ED Provider Notes (Signed)
[facility] EMERGENCY DEPARTMENT Provider Note   CSN: [id number] Arrival date & time: 02/14/21  2137     History Chief Complaint  Patient presents with   Chest Pain    Luanna SalkJasmine T Lhommedieu is a 35 y.o. female with a past medical history significant for asthma, emphysema, and history of migraines who presents to the ED due to chest pain x2 days.  Pain located in central, substernal region which patient describes as a pressure-like sensation.  Relieved by sitting straight up.  Worse with exertion.  Associated with shortness of breath and nausea.  Denies diaphoresis.  Patient admits to smoking 1 black and mild daily.  She admits to a family history of early CAD (maternal aunt). No previous CVA or MI.  Patient notes prior to onset of chest pain she was experiencing fever, chills, headaches, nausea, dizziness x1 week.  Patient notes her boyfriend had similar symptoms on Friday.  T-max 103.9 F on Friday.  She has been taking DayQuil and ibuprofen with moderate relief.  Patient denies sick contacts and known Covid exposures.  She has received both her COVID vaccines however, not her booster shot.  She admits to a few episodes of nonbloody, nonbilious emesis and nonbloody diarrhea over the past week.  Patient denies history of blood clots, recent surgeries, recent long immobilizations, hormonal treatments, lower extremity edema.   History obtained from patient and past medical records. No interpreter used during encounter.   HPI: A 35 year old patient with a history of obesity presents for evaluation of chest pain. Initial onset of pain was less than one hour ago. The patient's chest pain is described as heaviness/pressure/tightness and is worse with exertion. The patient complains of nausea. The patient's chest pain is middle- or left-sided, is not well-localized, is not sharp and does not radiate to the arms/jaw/neck. The patient denies diaphoresis. The patient has smoked in the past 90 days  and has a family history of coronary artery disease in a first-degree relative with onset less than age 33. The patient has no history of stroke, has no history of peripheral artery disease, denies any history of treated diabetes, is not hypertensive and has no history of hypercholesterolemia.   Past Medical History:  Diagnosis Date   Asthma    Emphysema of lung (HCC)    Headache(784.0)    Migraines   Heart murmur    Noncompliance with medications     Patient Active Problem List   Diagnosis Date Noted   Chest pain at rest 11/29/2011   SOB (shortness of breath) 11/29/2011   Chest pain 11/29/2011   Asthma exacerbation 11/08/2011   Tinea versicolor 11/08/2011   Cough 11/08/2011    History reviewed. No pertinent surgical history.   OB History    Gravida  0   Para  0   Term  0   Preterm  0   AB  0   Living  0     SAB  0   IAB  0   Ectopic  0   Multiple  0   Live Births  0           Family History  Problem Relation Age of Onset   Alcohol abuse Other        grandparents   Arthritis Other        grandparent   Cancer Other        relative   Stroke Other        grandparent   Kidney  disease Other        grandparents   Diabetes Other        grandparent   Asthma Brother     Social History   Tobacco Use   Smoking status: Current Every Day Smoker    Packs/day: 0.50    Types: Cigarettes    Last attempt to quit: 07/08/2011    Years since quitting: 9.6   Smokeless tobacco: Never Used  Vaping Use   Vaping Use: Never used  Substance Use Topics   Alcohol use: Yes    Comment: occ   Drug use: Yes    Types: Marijuana    Home Medications Prior to Admission medications   Medication Sig Start Date End Date Taking? Authorizing Provider  budesonide-formoterol (SYMBICORT) 160-4.5 MCG/ACT inhaler Inhale 2 puffs into the lungs 2 (two) times daily. Patient taking differently: Inhale 1 puff into the lungs 2 (two) times daily.  08/05/18    Zadie Rhine, MD  metroNIDAZOLE (FLAGYL) 500 MG tablet Take 1 tablet (500 mg total) by mouth 2 (two) times daily. 08/21/20   Wurst, [place], PA-C  norethindrone (MICRONOR) 0.35 MG tablet Take 1 tablet by mouth daily.    [provider]    Allergies    Sudafed [pseudoephedrine hcl]  Review of Systems   Review of Systems  Constitutional: Positive for chills and fever.  Respiratory: Positive for shortness of breath.   Cardiovascular: Positive for chest pain. Negative for leg swelling.  Gastrointestinal: Positive for diarrhea, nausea and vomiting. Negative for abdominal pain.  Neurological: Positive for headaches.  All other systems reviewed and are negative.   Physical Exam Updated Vital Signs BP 114/69    Pulse 63    Temp 98.8 F (37.1 C) (Oral)    Resp 18    Ht 5\' 3"  (1.6 m)    Wt 95.3 kg    LMP 01/06/2021    SpO2 100%    BMI 37.20 kg/m   Physical Exam Vitals and nursing note reviewed.  Constitutional:      General: She is not in acute distress. HENT:     Head: Normocephalic.  Eyes:     Pupils: Pupils are equal, round, and reactive to light.  Cardiovascular:     Rate and Rhythm: Normal rate and regular rhythm.     Pulses: Normal pulses.     Heart sounds: Normal heart sounds. No murmur heard. No friction rub. No gallop.   Pulmonary:     Effort: Pulmonary effort is normal.     Breath sounds: Normal breath sounds.     Comments: Respirations equal and unlabored, patient able to speak in full sentences, lungs clear to auscultation bilaterally Chest:     Comments: Reproducible anterior chest wall tenderness.  No crepitus or deformity. Abdominal:     General: Abdomen is flat. Bowel sounds are normal. There is no distension.     Palpations: Abdomen is soft.     Tenderness: There is no abdominal tenderness. There is no guarding or rebound.     Comments: Abdomen soft, nondistended, nontender to palpation in all quadrants without guarding or peritoneal signs. No  rebound.   Musculoskeletal:     Cervical back: Neck supple.     Comments: No lower extremity edema. Negative homan sign bilaterally.  Skin:    General: Skin is warm and dry.  Neurological:     General: No focal deficit present.  Psychiatric:        Mood and Affect: Mood normal.  Behavior: Behavior normal.     ED Results / Procedures / Treatments   Labs (all labs ordered are listed, but only abnormal results are displayed) Labs Reviewed  CBC WITH DIFFERENTIAL/PLATELET - Abnormal; Notable for the following components:      Result Value   Eosinophils Absolute 0.7 (*)    All other components within normal limits  SARS CORONAVIRUS 2 (TAT 6-24 HRS)  COMPREHENSIVE METABOLIC PANEL  PREGNANCY, URINE  LIPASE, BLOOD  TROPONIN I (HIGH SENSITIVITY)    EKG EKG Interpretation  Date/Time:  Wednesday February 14 2021 21:45:46 EST Ventricular Rate:  90 PR Interval:  130 QRS Duration: 72 QT Interval:  340 QTC Calculation: 415 R Axis:   88 Text Interpretation: Normal sinus rhythm with sinus arrhythmia Cannot rule out Anterior infarct , age undetermined Abnormal ECG Poor data quality Confirmed by Linwood Dibbles [phone]) on 02/14/2021 9:57:43 PM   Radiology No results found.  Procedures Procedures   Medications Ordered in ED Medications - No data to display  ED Course  I have reviewed the triage vital signs and the nursing notes.  Pertinent labs & imaging results that were available during my care of the patient were reviewed by me and considered in my medical decision making (see chart for details).    MDM Rules/Calculators/A&P HEAR Score: 34                       35 year old female presents to the ED due to central chest pain which she describes as pressure for the past 2 days.  Chest pain preceded by Covid-like symptoms for the past week.  Patient is vaccinated against COVID-19 however, has not received her booster shot.  Upon arrival, vitals all within normal limits.  Patient  is afebrile, not tachycardic or hypoxic.  Patient in no acute distress and non-ill-appearing.  Physical exam reassuring.  Abdomen soft, nondistended, nontender.  Lungs clear to auscultation bilaterally.  Reproducible anterior chest wall tenderness without crepitus or deformity. Troponin, chest x-ray, and EKG ordered to rule out cardiac etiology.  Routine labs to rule out electrolyte derangements.  Covid test rule out Covid infection.  Suspect chest pain related to MSK etiology given reproducible nature on exam. Suspect preceding symptoms related to COVID infection vs. Other viral etiology. Low suspicion for bacterial infection.  PERC negative and low risk using Wells criteria.  Doubt PE/DVT.  Presentation nonconcerning for dissection.  CBC unremarkable no leukocytosis and normal hemoglobin.  Pregnancy test negative.  EKG personally reviewed which demonstrates normal sinus rhythm with no signs of acute ischemia.  CMP unremarkable with normal renal function no major electrolyte derangements.  Lipase normal at 39.  Chest x-ray personally reviewed which is negative for signs of pneumonia, pneumothorax, widened mediastinum. Troponin normal at 3. Will obtain delta troponin to rule out ACS.   Patient handed off to Dr. Judd Lien pending delta troponin. If delta troponin flat, patient may be discharged home with symptomatic treatment and PCP follow-up.  FRANCY MCILVAINE was evaluated in Emergency Department on 02/14/2021 for the symptoms described in the history of present illness. She was evaluated in the context of the global COVID-19 pandemic, which necessitated consideration that the patient might be at risk for infection with the SARS-CoV-2 virus that causes COVID-19. Institutional protocols and algorithms that pertain to the evaluation of patients at risk for COVID-19 are in a state of rapid change based on information released by regulatory bodies including the CDC and federal and state organizations. These  policies and algorithms were followed during the patient's care in the ED.  Final Clinical Impression(s) / ED Diagnoses Final diagnoses:  Nonspecific chest pain  Suspected COVID-19 virus infection    Rx / DC Orders ED Discharge Orders    None       Jesusita Oka 02/15/21 0001    Linwood Dibbles, MD 02/15/21 1037

## 2021-02-15 ENCOUNTER — Telehealth (HOSPITAL_BASED_OUTPATIENT_CLINIC_OR_DEPARTMENT_OTHER): Payer: Self-pay | Admitting: *Deleted

## 2021-02-15 LAB — TROPONIN I (HIGH SENSITIVITY): Troponin I (High Sensitivity): 3 ng/L (ref <18)

## 2021-02-15 NOTE — Discharge Instructions (Signed)
As discussed, all of your labs are reassuring today.  I suspect your chest pain is related to musculoskeletal etiology.  Your Covid test is pending.  Results should be available within the next 24 hours.  I am sending you home with nausea medication, cough medication, and ibuprofen.  Take as needed for your symptoms.  I have included the number for [facility].  Please call to schedule appointment to establish care.  Return to the ER for new or worsening symptoms.

## 2021-02-15 NOTE — ED Provider Notes (Signed)
Care assumed from Dr. Beryle Beams at shift change. Patient awaiting results of a second troponin. This has returned and is negative. Patient with no real risk factors and premenopausal. I highly doubt a cardiac etiology. Patient will be discharged and is to follow-up with primary doctor next week/return if worsens.   Geoffery Lyons, MD 02/15/21 [phone]

## 2021-06-26 ENCOUNTER — Other Ambulatory Visit: Payer: Self-pay

## 2021-06-26 ENCOUNTER — Emergency Department (HOSPITAL_BASED_OUTPATIENT_CLINIC_OR_DEPARTMENT_OTHER)
Admission: EM | Admit: 2021-06-26 | Discharge: 2021-06-26 | Disposition: A | Discharge status: Home / Self Care | Payer: Medicaid Other | Attending: Emergency Medicine | Admitting: Emergency Medicine

## 2021-06-26 ENCOUNTER — Encounter (HOSPITAL_BASED_OUTPATIENT_CLINIC_OR_DEPARTMENT_OTHER): Payer: Self-pay

## 2021-06-26 DIAGNOSIS — Z87891 Personal history of nicotine dependence: Secondary | ICD-10-CM | POA: Insufficient documentation

## 2021-06-26 DIAGNOSIS — K2900 Acute gastritis without bleeding: Secondary | ICD-10-CM | POA: Insufficient documentation

## 2021-06-26 DIAGNOSIS — K069 Disorder of gingiva and edentulous alveolar ridge, unspecified: Secondary | ICD-10-CM

## 2021-06-26 DIAGNOSIS — K068 Other specified disorders of gingiva and edentulous alveolar ridge: Secondary | ICD-10-CM | POA: Insufficient documentation

## 2021-06-26 DIAGNOSIS — B349 Viral infection, unspecified: Secondary | ICD-10-CM | POA: Insufficient documentation

## 2021-06-26 DIAGNOSIS — Z20822 Contact with and (suspected) exposure to covid-19: Secondary | ICD-10-CM | POA: Insufficient documentation

## 2021-06-26 DIAGNOSIS — B0089 Other herpesviral infection: Secondary | ICD-10-CM | POA: Insufficient documentation

## 2021-06-26 DIAGNOSIS — R110 Nausea: Secondary | ICD-10-CM

## 2021-06-26 DIAGNOSIS — J45909 Unspecified asthma, uncomplicated: Secondary | ICD-10-CM | POA: Insufficient documentation

## 2021-06-26 LAB — HIV ANTIBODY (ROUTINE TESTING W REFLEX): HIV Screen 4th Generation wRfx: NONREACTIVE

## 2021-06-26 LAB — COMPREHENSIVE METABOLIC PANEL
ALT: 20 U/L (ref 0–44)
AST: 22 U/L (ref 15–41)
Albumin: 3.7 g/dL (ref 3.5–5.0)
Alkaline Phosphatase: 52 U/L (ref 38–126)
Anion gap: 6 (ref 5–15)
BUN: 11 mg/dL (ref 6–20)
CO2: 26 mmol/L (ref 22–32)
Calcium: 9 mg/dL (ref 8.9–10.3)
Chloride: 104 mmol/L (ref 98–111)
Creatinine, Ser: 0.83 mg/dL (ref 0.44–1.00)
GFR, Estimated: >60 mL/min (ref >60)
Glucose, Bld: 94 mg/dL (ref 70–99)
Potassium: 3.7 mmol/L (ref 3.5–5.1)
Sodium: 136 mmol/L (ref 135–145)
Total Bilirubin: 0.4 mg/dL (ref 0.3–1.2)
Total Protein: 7.6 g/dL (ref 6.5–8.1)

## 2021-06-26 LAB — URINALYSIS, ROUTINE W REFLEX MICROSCOPIC
Bilirubin Urine: NEGATIVE
Glucose, UA: NEGATIVE mg/dL
Hgb urine dipstick: NEGATIVE
Ketones, ur: NEGATIVE mg/dL
Leukocytes,Ua: NEGATIVE
Nitrite: NEGATIVE
Protein, ur: NEGATIVE mg/dL
Specific Gravity, Urine: 1.025 (ref 1.005–1.030)
pH: 6 (ref 5.0–8.0)

## 2021-06-26 LAB — CBC
HCT: 39.4 % (ref 36.0–46.0)
Hemoglobin: 12.3 g/dL (ref 12.0–15.0)
MCH: 28.1 pg (ref 26.0–34.0)
MCHC: 31.2 g/dL (ref 30.0–36.0)
MCV: 90.2 fL (ref 80.0–100.0)
Platelets: 426 10*3/uL — ABNORMAL HIGH (ref 150–400)
RBC: 4.37 MIL/uL (ref 3.87–5.11)
RDW: 13.7 % (ref 11.5–15.5)
WBC: 10 10*3/uL (ref 4.0–10.5)
nRBC: 0 % (ref 0.0–0.2)

## 2021-06-26 LAB — PREGNANCY, URINE: Preg Test, Ur: NEGATIVE

## 2021-06-26 LAB — RESP PANEL BY RT-PCR (FLU A&B, COVID) ARPGX2
Influenza A by PCR: NEGATIVE
Influenza B by PCR: NEGATIVE
SARS Coronavirus 2 by RT PCR: NEGATIVE

## 2021-06-26 LAB — LIPASE, BLOOD: Lipase: 33 U/L (ref 11–51)

## 2021-06-26 MED ORDER — SODIUM CHLORIDE 0.9 % IV BOLUS: 1000.0000 mL | Freq: Once | INTRAVENOUS | Status: DC

## 2021-06-26 MED ORDER — ONDANSETRON HCL 4 MG/2ML IJ SOLN: 4.0000 mg | Freq: Once | INTRAMUSCULAR | Status: DC

## 2021-06-26 MED ORDER — PANTOPRAZOLE SODIUM 20 MG PO TBEC: 40.0000 mg | DELAYED_RELEASE_TABLET | Freq: Every day | ORAL | 0 refills | Status: DC

## 2021-06-26 MED ORDER — VALACYCLOVIR HCL 1 G PO TABS: 1000.0000 mg | ORAL_TABLET | Freq: Two times a day (BID) | ORAL | 0 refills | Status: AC

## 2021-06-26 MED ORDER — DICYCLOMINE HCL 10 MG/ML IM SOLN: 20.0000 mg | Freq: Once | INTRAMUSCULAR | Status: DC

## 2021-06-26 MED ORDER — ONDANSETRON 4 MG PO TBDP: 4.0000 mg | ORAL_TABLET | Freq: Three times a day (TID) | ORAL | 0 refills | Status: DC | PRN

## 2021-06-26 NOTE — Discharge Instructions (Addendum)
For your constipation, you may take senokot, colace, and miralax as well as staying hydrated and active.  (You may take up to 4 caps of miralax in a 32oz bottle of gatorade or similar for one day if severe constipation.)

## 2021-06-26 NOTE — ED Triage Notes (Signed)
Pt c/o vomiting on and off since June 21st. States has lost voice & has sore throat. States has blisters to right side of mouth. Also wants HIV test. States has been constipated as well.

## 2021-06-26 NOTE — ED Provider Notes (Signed)
[facility] EMERGENCY DEPARTMENT Provider Note   CSN: [id number] Arrival date & time: 06/26/21  1207     History Chief Complaint  Patient presents with   Abdominal Pain    Teresa Peterson is a 36 y.o. female.  HPI    35yo female with history of asthma presents with concern for abdominal pain and nausea, also with constipation, recent illness including fever 6/21 which has improved, sore throat which improved, continued hoarse voice and cough.  Abdominal pain epigastric to the middle of abdomen 23rd, if drink pineapple or orange juice helps, fruit cups or apple sauce, hurts more when straining No hx of surgery on abdomen  Took castor oil for constipation, vmoited it up. Constipated for about 1.5wk. had runny stool this AM.    Sick since Tuesday June 21st, vomiting and fever, fever for one day, loss of appetite on 23rd, continued vomiting for a few more days. Nausea continuing now but not vomiting.   Sore throat, 22nd June, now not having sore throat, but lost voice on 7/2 completely Tried lemon and ginger tea with honey Headaches have been coming and going Ibuprofen 600mg    Past Medical History:  Diagnosis Date   Asthma    Emphysema of lung (HCC)    Headache(784.0)    Migraines   Heart murmur    Noncompliance with medications     Patient Active Problem List   Diagnosis Date Noted   Chest pain at rest 11/29/2011   SOB (shortness of breath) 11/29/2011   Chest pain 11/29/2011   Asthma exacerbation 11/08/2011   Tinea versicolor 11/08/2011   Cough 11/08/2011    History reviewed. No pertinent surgical history.   OB History     Gravida  0   Para  0   Term  0   Preterm  0   AB  0   Living  0      SAB  0   IAB  0   Ectopic  0   Multiple  0   Live Births  0           Family History  Problem Relation Age of Onset   Alcohol abuse Other        grandparents   Arthritis Other        grandparent   Cancer Other        relative    Stroke Other        grandparent   Kidney disease Other        grandparents   Diabetes Other        grandparent   Asthma Brother     Social History   Tobacco Use   Smoking status: Former    Packs/day: 0.50    Pack years: 0.00    Types: Cigarettes    Quit date: 07/08/2011    Years since quitting: 9.9   Smokeless tobacco: Never  Vaping Use   Vaping Use: Never used  Substance Use Topics   Alcohol use: Yes    Comment: occ   Drug use: Yes    Types: Marijuana    Home Medications Prior to Admission medications   Medication Sig Start Date End Date Taking? Authorizing Provider  ondansetron (ZOFRAN ODT) 4 MG disintegrating tablet Take 1 tablet (4 mg total) by mouth every 8 (eight) hours as needed for nausea or vomiting. 06/26/21  Yes 08/27/21, MD  pantoprazole (PROTONIX) 20 MG tablet Take 2 tablets (40 mg total) by mouth daily  for 10 days. 06/26/21 07/06/21 Yes Alvira Monday, MD  valACYclovir (VALTREX) 1000 MG tablet Take 1 tablet (1,000 mg total) by mouth 2 (two) times daily for 7 days. 06/26/21 07/03/21 Yes Alvira Monday, MD  benzonatate (TESSALON) 100 MG capsule Take 1 capsule (100 mg total) by mouth every 8 (eight) hours. 02/14/21   Mannie Stabile, PA-C  budesonide-formoterol (SYMBICORT) 160-4.5 MCG/ACT inhaler Inhale 2 puffs into the lungs 2 (two) times daily. Patient taking differently: Inhale 1 puff into the lungs 2 (two) times daily.  08/05/18   Zadie Rhine, MD  ibuprofen (ADVIL) 600 MG tablet Take 1 tablet (600 mg total) by mouth every 6 (six) hours as needed. 02/14/21   Mannie Stabile, PA-C  metroNIDAZOLE (FLAGYL) 500 MG tablet Take 1 tablet (500 mg total) by mouth 2 (two) times daily. 08/21/20   Wurst, [place], PA-C  norethindrone (MICRONOR) 0.35 MG tablet Take 1 tablet by mouth daily.    [provider]    Allergies    Sudafed [pseudoephedrine hcl]  Review of Systems   Review of Systems  Constitutional:  Positive for appetite change.  Negative for fever (none since 22nd).  HENT:  Positive for voice change. Negative for sore throat.   Respiratory:  Positive for cough. Negative for shortness of breath.   Cardiovascular:  Negative for chest pain.  Gastrointestinal:  Positive for abdominal pain, constipation and nausea. Negative for diarrhea and vomiting.  Genitourinary:  Negative for dysuria.  Skin:  Positive for rash and wound.  Neurological:  Positive for headaches.   Physical Exam Updated Vital Signs BP (!) 149/96 (BP Location: Left Arm)   Pulse 64   Temp 98.5 F (36.9 C) (Oral)   Resp 18   Ht 5\' 3"  (1.6 m)   Wt 94.8 kg   LMP 06/18/2021 (Exact Date)   SpO2 99%   BMI 37.02 kg/m   Physical Exam Vitals and nursing note reviewed.  Constitutional:      General: She is not in acute distress.    Appearance: Normal appearance. She is well-developed. She is not ill-appearing, toxic-appearing or diaphoretic.  HENT:     Head: Normocephalic and atraumatic.     Mouth/Throat:     Pharynx: Oropharyngeal exudate (left tonsil) present.  Eyes:     Conjunctiva/sclera: Conjunctivae normal.  Cardiovascular:     Rate and Rhythm: Normal rate and regular rhythm.     Pulses: Normal pulses.     Heart sounds: Normal heart sounds. No murmur heard.   No friction rub. No gallop.  Pulmonary:     Effort: Pulmonary effort is normal. No respiratory distress.     Breath sounds: Normal breath sounds. No wheezing or rales.  Abdominal:     General: Abdomen is flat. There is no distension or abdominal bruit.     Palpations: Abdomen is soft.     Tenderness: Tenderness: mild epigastric. There is no guarding. Negative signs include Murphy's sign and McBurney's sign.  Musculoskeletal:        General: No tenderness, deformity or signs of injury.     Cervical back: Normal range of motion. No rigidity.  Skin:    General: Skin is warm and dry.     Coloration: Skin is not jaundiced or pale.     Findings: No erythema or rash.     Comments:  Follicle inflamed/healing right groin  Perioral/labial ulcer right side  Neurological:     General: No focal deficit present.     Mental Status: She  is alert and oriented to person, place, and time.    ED Results / Procedures / Treatments   Labs (all labs ordered are listed, but only abnormal results are displayed) Labs Reviewed  CBC - Abnormal; Notable for the following components:      Result Value   Platelets 426 (*)    All other components within normal limits  RESP PANEL BY RT-PCR (FLU A&B, COVID) ARPGX2  LIPASE, BLOOD  COMPREHENSIVE METABOLIC PANEL  URINALYSIS, ROUTINE W REFLEX MICROSCOPIC  PREGNANCY, URINE  HIV ANTIBODY (ROUTINE TESTING W REFLEX)  RPR    EKG None  Radiology No results found.  Procedures Procedures   Medications Ordered in ED Medications - No data to display  ED Course  I have reviewed the triage vital signs and the nursing notes.  Pertinent labs & imaging results that were available during my care of the patient were reviewed by me and considered in my medical decision making (see chart for details).    MDM Rules/Calculators/A&P                          34yo female with history of asthma presents with concern for abdominal pain and nausea, also with constipation, recent illness including fever 6/21 which has improved, sore throat which improved, continued hoarse voice and cough.    Cough is not worsening, she is not hypoxic, no focal findings on exam, no fever, at this time low suspicion for pneumonia, pneumothorax.  No sign of PTA/RPA on exam, sore throat improved, low suspicion at this time for strep or mono. No clear findings to suggest thrush on exam. Suspect likely viral pharyngitis.  Notes some skin lesions, not consistent with abscess or cellulitis.  Constipation but no signs of bowel obstruction by history or exam. Lipase and transaminases WNL, doubt pancreatitis.  Exam and history not consistent with cholecystitis, appendicitis,  diverticulitis. Not having pelvic symptoms to suggest torsion or TOA. UA without infection. Pregnancy test negative.  Suspect likely viral syndrome causing combination of symptoms and that abdominal pain specifically is viral gastritis. Recommend zofran, PPI, supportive care and PCP follow up. She also has cold sore we will treat with valacyclovir that began yesterday.  Patient discharged in stable condition with understanding of reasons to return.  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

## 2021-06-27 LAB — RPR: RPR Ser Ql: NONREACTIVE

## 2021-08-12 ENCOUNTER — Other Ambulatory Visit: Payer: Self-pay

## 2021-08-12 ENCOUNTER — Encounter (HOSPITAL_BASED_OUTPATIENT_CLINIC_OR_DEPARTMENT_OTHER): Payer: Self-pay | Admitting: Emergency Medicine

## 2021-08-12 ENCOUNTER — Emergency Department (HOSPITAL_BASED_OUTPATIENT_CLINIC_OR_DEPARTMENT_OTHER)
Admission: EM | Admit: 2021-08-12 | Discharge: 2021-08-12 | Disposition: A | Discharge status: Home / Self Care | Payer: Medicaid Other | Attending: Emergency Medicine | Admitting: Emergency Medicine

## 2021-08-12 DIAGNOSIS — Z20822 Contact with and (suspected) exposure to covid-19: Secondary | ICD-10-CM

## 2021-08-12 DIAGNOSIS — J45909 Unspecified asthma, uncomplicated: Secondary | ICD-10-CM | POA: Insufficient documentation

## 2021-08-12 DIAGNOSIS — J069 Acute upper respiratory infection, unspecified: Secondary | ICD-10-CM | POA: Insufficient documentation

## 2021-08-12 DIAGNOSIS — U071 COVID-19: Secondary | ICD-10-CM | POA: Insufficient documentation

## 2021-08-12 DIAGNOSIS — Z79899 Other long term (current) drug therapy: Secondary | ICD-10-CM | POA: Insufficient documentation

## 2021-08-12 DIAGNOSIS — Z87891 Personal history of nicotine dependence: Secondary | ICD-10-CM | POA: Insufficient documentation

## 2021-08-12 LAB — RESP PANEL BY RT-PCR (FLU A&B, COVID) ARPGX2
Influenza A by PCR: NEGATIVE
Influenza B by PCR: NEGATIVE
SARS Coronavirus 2 by RT PCR: POSITIVE — AB

## 2021-08-12 MED ORDER — PREDNISONE 50 MG PO TABS
60.0000 mg | ORAL_TABLET | Freq: Once | ORAL | Status: AC
  Administered 2021-08-12: 60 mg via ORAL
  Filled 2021-08-12: qty 1

## 2021-08-12 MED ORDER — PREDNISONE 20 MG PO TABS: 40.0000 mg | ORAL_TABLET | Freq: Every day | ORAL | 0 refills | Status: AC

## 2021-08-12 NOTE — ED Provider Notes (Signed)
[facility] EMERGENCY DEPARTMENT Provider Note   CSN: [id number] Arrival date & time: 08/12/21  1758     History Chief Complaint  Patient presents with   Fever   Generalized Body Aches    Teresa Peterson is a 35 y.o. female with past medical history significant for asthma presents to emergency department today with chief complaint of subjective fever and generalized body aches x2 days.  Patient was sent from her employer to get a COVID test before she is able to return to work.  Patient states she has spent most of the time sleeping because she was feeling so poorly.  She is tolerating p.o. intake without any difficulty.  She has a history of asthma and has not heard any wheezing, she reports compliance with inhalers.  Patient states she has been around sick contacts with clients at work.  Denies any chills, cough, shortness of breath, chest pain, urinary symptoms, diarrhea, rash.  She has not taken any over-the-counter medications for symptoms prior to arrival.    Past Medical History:  Diagnosis Date   Asthma    Emphysema of lung (HCC)    Headache(784.0)    Migraines   Heart murmur    Noncompliance with medications     Patient Active Problem List   Diagnosis Date Noted   Chest pain at rest 11/29/2011   SOB (shortness of breath) 11/29/2011   Chest pain 11/29/2011   Asthma exacerbation 11/08/2011   Tinea versicolor 11/08/2011   Cough 11/08/2011    History reviewed. No pertinent surgical history.   OB History     Gravida  0   Para  0   Term  0   Preterm  0   AB  0   Living  0      SAB  0   IAB  0   Ectopic  0   Multiple  0   Live Births  0           Family History  Problem Relation Age of Onset   Alcohol abuse Other        grandparents   Arthritis Other        grandparent   Cancer Other        relative   Stroke Other        grandparent   Kidney disease Other        grandparents   Diabetes Other        grandparent    Asthma Brother     Social History   Tobacco Use   Smoking status: Former    Packs/day: 0.50    Types: Cigarettes    Quit date: 07/08/2011    Years since quitting: 10.1   Smokeless tobacco: Never  Vaping Use   Vaping Use: Never used  Substance Use Topics   Alcohol use: Yes    Comment: occ   Drug use: Yes    Types: Marijuana    Home Medications Prior to Admission medications   Medication Sig Start Date End Date Taking? Authorizing Provider  predniSONE (DELTASONE) 20 MG tablet Take 2 tablets (40 mg total) by mouth daily for 5 days. 08/13/21 08/18/21 Yes Walisiewicz, Aseret Hoffman E, PA-C  benzonatate (TESSALON) 100 MG capsule Take 1 capsule (100 mg total) by mouth every 8 (eight) hours. 02/14/21   Mannie Stabile, PA-C  budesonide-formoterol (SYMBICORT) 160-4.5 MCG/ACT inhaler Inhale 2 puffs into the lungs 2 (two) times daily. Patient taking differently: Inhale 1 puff into the  lungs 2 (two) times daily.  08/05/18   Zadie Rhine, MD  ibuprofen (ADVIL) 600 MG tablet Take 1 tablet (600 mg total) by mouth every 6 (six) hours as needed. 02/14/21   Mannie Stabile, PA-C  metroNIDAZOLE (FLAGYL) 500 MG tablet Take 1 tablet (500 mg total) by mouth 2 (two) times daily. 08/21/20   Wurst, [place], PA-C  norethindrone (MICRONOR) 0.35 MG tablet Take 1 tablet by mouth daily.    [provider]  ondansetron (ZOFRAN ODT) 4 MG disintegrating tablet Take 1 tablet (4 mg total) by mouth every 8 (eight) hours as needed for nausea or vomiting. 06/26/21   Alvira Monday, MD  pantoprazole (PROTONIX) 20 MG tablet Take 2 tablets (40 mg total) by mouth daily for 10 days. 06/26/21 07/06/21  Alvira Monday, MD    Allergies    Sudafed [pseudoephedrine hcl]  Review of Systems   Review of Systems All other systems are reviewed and are negative for acute change except as noted in the HPI.  Physical Exam Updated Vital Signs BP 131/82 (BP Location: Right Arm)   Pulse 86   Temp 98.6 F (37 C) (Oral)    Resp 18   Ht 5\' 3"  (1.6 m)   Wt 97.5 kg   LMP 07/07/2021   SpO2 100%   BMI 38.09 kg/m   Physical Exam Vitals and nursing note reviewed.  Constitutional:      Appearance: She is well-developed. She is not ill-appearing or toxic-appearing.  HENT:     Head: Normocephalic and atraumatic.     Right Ear: Tympanic membrane and external ear normal.     Left Ear: Tympanic membrane and external ear normal.     Nose: Nose normal.  Eyes:     General: No scleral icterus.       Right eye: No discharge.        Left eye: No discharge.     Conjunctiva/sclera: Conjunctivae normal.  Neck:     Vascular: No JVD.     Meningeal: Brudzinski's sign and Kernig's sign absent.     Comments: No meningeal signs Cardiovascular:     Rate and Rhythm: Normal rate and regular rhythm.     Pulses: Normal pulses.     Heart sounds: Normal heart sounds.  Pulmonary:     Effort: Pulmonary effort is normal.     Breath sounds: Wheezing present. No rhonchi or rales.     Comments: Very faint expiratory wheeze heard in right upper lung field.  Normal work of breathing.  Oxygen saturation is 100% on room air.  Patient is talking full sentences. Abdominal:     General: There is no distension.  Musculoskeletal:        General: Normal range of motion.     Cervical back: Normal range of motion.  Lymphadenopathy:     Cervical: No cervical adenopathy.  Skin:    General: Skin is warm and dry.     Capillary Refill: Capillary refill takes less than 2 seconds.     Findings: No rash.  Neurological:     Mental Status: She is oriented to person, place, and time.     GCS: GCS eye subscore is 4. GCS verbal subscore is 5. GCS motor subscore is 6.     Comments: Fluent speech, no facial droop.  Psychiatric:        Behavior: Behavior normal.    ED Results / Procedures / Treatments   Labs (all labs ordered are listed, but only abnormal results  are displayed) Labs Reviewed  RESP PANEL BY RT-PCR (FLU A&B, COVID) ARPGX2     EKG None  Radiology No results found.  Procedures Procedures   Medications Ordered in ED Medications  predniSONE (DELTASONE) tablet 60 mg (60 mg Oral Given 08/12/21 1956)    ED Course  I have reviewed the triage vital signs and the nursing notes.  Pertinent labs & imaging results that were available during my care of the patient were reviewed by me and considered in my medical decision making (see chart for details).    MDM Rules/Calculators/A&P                           History provided by patient with additional history obtained from chart review.    Patient presenting for COVID test because of URI symptoms.  She reports objective fevers at home, she is afebrile here without the use of antipyretics.  She is well-appearing and nontoxic.  She does have a history of asthma and I appreciate a faint expiratory wheeze on exam.  She has no signs of respiratory distress.  No rhonchi or rails.  Patient is agreeable for p.o. steroids.  She does not feel like she needs a chest x-ray.  I think this is reasonable given normal vital signs here.  She has no hypoxia or increased work of breathing.  Patient is stable to be discharged home with short prednisone burst.  COVID test collected and she will follow-up with results on MyChart.  Discussed symptomatic care and strict return precautions.  Patient discharged home in stable condition.   Portions of this note were generated with Scientist, clinical (histocompatibility and immunogenetics). Dictation errors may occur despite best attempts at proofreading.  Final Clinical Impression(s) / ED Diagnoses Final diagnoses:  Viral upper respiratory tract infection  COVID-19 virus test result unknown    Rx / DC Orders ED Discharge Orders          Ordered    predniSONE (DELTASONE) 20 MG tablet  Daily        08/12/21 1953             Kandice Hams 08/12/21 2001    Milagros Loll, MD 08/14/21 [phone]

## 2021-08-12 NOTE — Discharge Instructions (Addendum)
Prescription for prednisone sent to the pharmacy to help with your asthma.  Try over-the-counter treatment for your symptoms including Tylenol and ibuprofen.  Covid test result will be available in MyChart.

## 2021-08-12 NOTE — ED Notes (Signed)
Pt A&OX4, ambulatory at d/c with independent steady gait, NAD. Pt verbalized understanding of d/c instructions, prescription and follow up care. 

## 2021-08-12 NOTE — ED Triage Notes (Signed)
Pt sts her employer sent her here for a Covid test; she c/o fever, body aches

## 2021-12-26 ENCOUNTER — Encounter: Payer: Self-pay | Admitting: Nurse Practitioner

## 2021-12-26 ENCOUNTER — Other Ambulatory Visit: Payer: Self-pay

## 2021-12-26 ENCOUNTER — Ambulatory Visit (INDEPENDENT_AMBULATORY_CARE_PROVIDER_SITE_OTHER): Payer: Commercial Managed Care - PPO | Admitting: Nurse Practitioner

## 2021-12-26 VITALS — BP 140/88 | HR 76 | Temp 97.0°F | Ht 63.5 in | Wt 204.4 lb

## 2021-12-26 DIAGNOSIS — K5900 Constipation, unspecified: Secondary | ICD-10-CM | POA: Diagnosis not present

## 2021-12-26 DIAGNOSIS — K625 Hemorrhage of anus and rectum: Secondary | ICD-10-CM

## 2021-12-26 LAB — CBC
HCT: 39.9 % (ref 36.0–46.0)
Hemoglobin: 12.6 g/dL (ref 12.0–15.0)
MCHC: 31.7 g/dL (ref 30.0–36.0)
MCV: 90.2 fl (ref 78.0–100.0)
Platelets: 320 10*3/uL (ref 150.0–400.0)
RBC: 4.42 Mil/uL (ref 3.87–5.11)
RDW: 14.5 % (ref 11.5–15.5)
WBC: 8.4 10*3/uL (ref 4.0–10.5)

## 2021-12-26 LAB — IFOBT (OCCULT BLOOD): IFOBT: NEGATIVE

## 2021-12-26 NOTE — Patient Instructions (Addendum)
Go to lab for blood draw Maintain high fiber diet and adequate oral hydration with water Maintain upcoming appt for CPE (fasting 8hrs, ok to drink water)  Constipation, Adult Constipation is when a person has trouble pooping (having a bowel movement). When you have this condition, you may poop fewer than 3 times a week. Your poop (stool) may also be dry, hard, or bigger than normal. Follow these instructions at home: Eating and drinking  Eat foods that have a lot of fiber, such as: Fresh fruits and vegetables. Whole grains. Beans. Eat less of foods that are low in fiber and high in fat and sugar, such as: [place] fries. Hamburgers. Cookies. Candy. Soda. Drink enough fluid to keep your pee (urine) pale yellow. General instructions Exercise regularly or as told by your doctor. Try to do 150 minutes of exercise each week. Go to the restroom when you feel like you need to poop. Do not hold it in. Take over-the-counter and prescription medicines only as told by your doctor. These include any fiber supplements. When you poop: Do deep breathing while relaxing your lower belly (abdomen). Relax your pelvic floor. The pelvic floor is a group of muscles that support the rectum, bladder, and intestines (as well as the uterus in women). Watch your condition for any changes. Tell your doctor if you notice any. Keep all follow-up visits as told by your doctor. This is important. Contact a doctor if: You have pain that gets worse. You have a fever. You have not pooped for 4 days. You vomit. You are not hungry. You lose weight. You are bleeding from the opening of the butt (anus). You have thin, pencil-like poop. Get help right away if: You have a fever, and your symptoms suddenly get worse. You leak poop or have blood in your poop. Your belly feels hard or bigger than normal (bloated). You have very bad belly pain. You feel dizzy or you faint. Summary Constipation is when a person poops  fewer than 3 times a week, has trouble pooping, or has poop that is dry, hard, or bigger than normal. Eat foods that have a lot of fiber. Drink enough fluid to keep your pee (urine) pale yellow. Take over-the-counter and prescription medicines only as told by your doctor. These include any fiber supplements. This information is not intended to replace advice given to you by your health care provider. Make sure you discuss any questions you have with your health care provider. Document Revised: 10/27/2019 Document Reviewed: 10/27/2019 Elsevier Patient Education  2022 [employer].

## 2021-12-26 NOTE — Progress Notes (Signed)
Subjective:  Patient ID: Teresa Peterson, female    DOB: 1986-11-07  Age: 36 y.o. MRN: [id number]  CC: Establish Care (New patient/Blood in stool x 1 week./Pt states 2 weeks ago she was experiencing blood in stool but it has not happened since. )  Rectal Bleeding  The current episode started more than 1 week ago. The onset was sudden. The problem occurs rarely. The problem has been resolved. The patient is experiencing no pain. The stool is described as bloody. Prior successful therapies include diet changes. There was no prior unsuccessful therapy. Associated symptoms include abdominal pain. Pertinent negatives include no anorexia, no fever, no diarrhea, no hematemesis, no hemorrhoids, no nausea, no rectal pain, no vomiting, no hematuria, no vaginal bleeding, no vaginal discharge, no chest pain, no headaches, no coughing, no difficulty breathing and no rash. She has been Eating and drinking normally. Urine output has been normal. The last void occurred Less than 6 hours ago. Her past medical history is significant for recent change in diet. Her past medical history does not include recent antibiotic use or a recent illness. There were no sick contacts. She has received no recent medical care.  First noted Blood in stool around Thanksgiving period. Had 2 incidents 1week apart. States this was Associated with constipation. Reports Constipation was due to decreased oral hydration, increased cheese and soda consumption. LMP 10/29/21 and 12/04/21. Sexually active with no contraception. No dyspareunia. No Fhx of colorectal cancer or IBD.  Reviewed past Medical, Social and Family history today.  Outpatient Medications Prior to Visit  Medication Sig Dispense Refill   budesonide-formoterol (SYMBICORT) 160-4.5 MCG/ACT inhaler Inhale 2 puffs into the lungs 2 (two) times daily. (Patient taking differently: Inhale 1 puff into the lungs 2 (two) times daily.) 1 Inhaler 12   hydrOXYzine (ATARAX) 25 MG  tablet Take 25 mg by mouth 3 (three) times daily as needed.     ibuprofen (ADVIL) 600 MG tablet Take 1 tablet (600 mg total) by mouth every 6 (six) hours as needed. 30 tablet 0   metroNIDAZOLE (FLAGYL) 500 MG tablet Take 1 tablet (500 mg total) by mouth 2 (two) times daily. 14 tablet 0   norethindrone (MICRONOR) 0.35 MG tablet Take 1 tablet by mouth daily.     ondansetron (ZOFRAN ODT) 4 MG disintegrating tablet Take 1 tablet (4 mg total) by mouth every 8 (eight) hours as needed for nausea or vomiting. 20 tablet 0   benzonatate (TESSALON) 100 MG capsule Take 1 capsule (100 mg total) by mouth every 8 (eight) hours. (Patient not taking: Reported on 12/26/2021) 21 capsule 0   pantoprazole (PROTONIX) 20 MG tablet Take 2 tablets (40 mg total) by mouth daily for 10 days. 20 tablet 0   No facility-administered medications prior to visit.    ROS See HPI  Objective:  BP 140/88 (BP Location: Left Arm, Patient Position: Sitting, Cuff Size: Large)    Pulse 76    Temp (!) 97 F (36.1 C) (Temporal)    Ht 5' 3.5" (1.613 m)    Wt 204 lb 6.4 oz (92.7 kg)    SpO2 97%    BMI 35.64 kg/m   Physical Exam Vitals reviewed. Exam conducted with a chaperone present.  Cardiovascular:     Rate and Rhythm: Normal rate.     Pulses: Normal pulses.  Pulmonary:     Effort: Pulmonary effort is normal.  Abdominal:     General: Bowel sounds are normal.     Palpations: Abdomen is  soft.     Tenderness: There is no abdominal tenderness. There is no guarding or rebound.  Genitourinary:    Rectum: Guaiac result negative. No mass, tenderness, anal fissure, external hemorrhoid or internal hemorrhoid. Normal anal tone.  Neurological:     Mental Status: She is alert and oriented to person, place, and time.   Assessment & Plan:  This visit occurred during the SARS-CoV-2 public health emergency.  Safety protocols were in place, including screening questions prior to the visit, additional usage of staff PPE, and extensive cleaning  of exam room while observing appropriate contact time as indicated for disinfecting solutions.   Teresa Peterson was seen today for establish care.  Diagnoses and all orders for this visit:  Blood per rectum -     IFOBT POC (occult bld, rslt in office) -     CBC  Constipation, unspecified constipation type  Blood per rectum possibly due to fissure with recent incident of constipation. Advised to Maintain high fiber diet and adequate oral hydration with water.  Problem List Items Addressed This Visit   None Visit Diagnoses     Blood per rectum    -  Primary   Relevant Orders   IFOBT POC (occult bld, rslt in office) (Completed)   CBC   Constipation, unspecified constipation type           Follow-up: Return for maintain upcoming appt.  Wilfred Lacy, NP

## 2022-02-06 ENCOUNTER — Emergency Department (HOSPITAL_BASED_OUTPATIENT_CLINIC_OR_DEPARTMENT_OTHER)
Admission: EM | Admit: 2022-02-06 | Discharge: 2022-02-07 | Disposition: A | Discharge status: Home / Self Care | Payer: Commercial Managed Care - PPO | Attending: Emergency Medicine | Admitting: Emergency Medicine

## 2022-02-06 ENCOUNTER — Other Ambulatory Visit: Payer: Self-pay

## 2022-02-06 ENCOUNTER — Emergency Department (HOSPITAL_BASED_OUTPATIENT_CLINIC_OR_DEPARTMENT_OTHER): Payer: Commercial Managed Care - PPO

## 2022-02-06 ENCOUNTER — Encounter (HOSPITAL_BASED_OUTPATIENT_CLINIC_OR_DEPARTMENT_OTHER): Payer: Self-pay

## 2022-02-06 DIAGNOSIS — Z91010 Allergy to peanuts: Secondary | ICD-10-CM | POA: Diagnosis not present

## 2022-02-06 DIAGNOSIS — R112 Nausea with vomiting, unspecified: Secondary | ICD-10-CM | POA: Diagnosis not present

## 2022-02-06 DIAGNOSIS — Z20822 Contact with and (suspected) exposure to covid-19: Secondary | ICD-10-CM | POA: Insufficient documentation

## 2022-02-06 DIAGNOSIS — R109 Unspecified abdominal pain: Secondary | ICD-10-CM | POA: Diagnosis not present

## 2022-02-06 DIAGNOSIS — K529 Noninfective gastroenteritis and colitis, unspecified: Secondary | ICD-10-CM

## 2022-02-06 DIAGNOSIS — Z7951 Long term (current) use of inhaled steroids: Secondary | ICD-10-CM | POA: Insufficient documentation

## 2022-02-06 DIAGNOSIS — J45909 Unspecified asthma, uncomplicated: Secondary | ICD-10-CM | POA: Insufficient documentation

## 2022-02-06 LAB — COMPREHENSIVE METABOLIC PANEL
ALT: 13 U/L (ref 0–44)
AST: 17 U/L (ref 15–41)
Albumin: 4.5 g/dL (ref 3.5–5.0)
Alkaline Phosphatase: 54 U/L (ref 38–126)
Anion gap: 7 (ref 5–15)
BUN: 10 mg/dL (ref 6–20)
CO2: 28 mmol/L (ref 22–32)
Calcium: 9.8 mg/dL (ref 8.9–10.3)
Chloride: 97 mmol/L — ABNORMAL LOW (ref 98–111)
Creatinine, Ser: 0.96 mg/dL (ref 0.44–1.00)
GFR, Estimated: >60 mL/min (ref >60)
Glucose, Bld: 104 mg/dL — ABNORMAL HIGH (ref 70–99)
Potassium: 3.7 mmol/L (ref 3.5–5.1)
Sodium: 132 mmol/L — ABNORMAL LOW (ref 135–145)
Total Bilirubin: 0.7 mg/dL (ref 0.3–1.2)
Total Protein: 8.3 g/dL — ABNORMAL HIGH (ref 6.5–8.1)

## 2022-02-06 LAB — PREGNANCY, URINE: Preg Test, Ur: NEGATIVE

## 2022-02-06 LAB — URINALYSIS, ROUTINE W REFLEX MICROSCOPIC
Bilirubin Urine: NEGATIVE
Glucose, UA: NEGATIVE mg/dL
Hgb urine dipstick: NEGATIVE
Leukocytes,Ua: NEGATIVE
Nitrite: NEGATIVE
Specific Gravity, Urine: 1.023 (ref 1.005–1.030)
pH: 8 (ref 5.0–8.0)

## 2022-02-06 LAB — LIPASE, BLOOD: Lipase: 11 U/L (ref 11–51)

## 2022-02-06 LAB — RESP PANEL BY RT-PCR (FLU A&B, COVID) ARPGX2
Influenza A by PCR: NEGATIVE
Influenza B by PCR: NEGATIVE
SARS Coronavirus 2 by RT PCR: NEGATIVE

## 2022-02-06 LAB — CBC
HCT: 46.2 % — ABNORMAL HIGH (ref 36.0–46.0)
Hemoglobin: 14.3 g/dL (ref 12.0–15.0)
MCH: 28.2 pg (ref 26.0–34.0)
MCHC: 31 g/dL (ref 30.0–36.0)
MCV: 91.1 fL (ref 80.0–100.0)
Platelets: 376 10*3/uL (ref 150–400)
RBC: 5.07 MIL/uL (ref 3.87–5.11)
RDW: 14 % (ref 11.5–15.5)
WBC: 16 10*3/uL — ABNORMAL HIGH (ref 4.0–10.5)
nRBC: 0 % (ref 0.0–0.2)

## 2022-02-06 MED ORDER — ONDANSETRON HCL 4 MG/2ML IJ SOLN
4.0000 mg | Freq: Once | INTRAMUSCULAR | Status: AC
  Administered 2022-02-06: 4 mg via INTRAVENOUS
  Filled 2022-02-06: qty 2

## 2022-02-06 MED ORDER — SODIUM CHLORIDE 0.9 % IV BOLUS
1000.0000 mL | Freq: Once | INTRAVENOUS | Status: AC
  Administered 2022-02-06: 1000 mL via INTRAVENOUS

## 2022-02-06 MED ORDER — KETOROLAC TROMETHAMINE 30 MG/ML IJ SOLN
30.0000 mg | Freq: Once | INTRAMUSCULAR | Status: AC
  Administered 2022-02-06: 30 mg via INTRAVENOUS
  Filled 2022-02-06: qty 1

## 2022-02-06 NOTE — ED Triage Notes (Signed)
Patient here POV from Home   Patient endorses having Herpes and states she has been having Sores on her lip since 02/05.   Emesis since Monday. Constant today. Awoke today with same symptoms along with Chills. Associated with Mid ABD Pain.  No Fevers. Mild Intermittent Diarrhea.  NAD Noted during Triage. A&Ox4. GCS 15. Ambulatory.

## 2022-02-06 NOTE — ED Provider Notes (Signed)
[facility] EMERGENCY DEPT Provider Note   CSN: [id number] Arrival date & time: 02/06/22  1934     History  Chief Complaint  Patient presents with   Emesis    Teresa Peterson is a 36 y.o. female.  Patient is a 36 year old female with past medical history of asthma.  Patient presenting today for evaluation of nausea and vomiting.  This has been worsening over the past several days.  She denies having consumed any suspicious foods, but does report several coworkers ill with similar symptoms.  She denies any fevers or chills.  She denies any bloody stool or vomit.  She denies diarrhea.  The history is provided by the patient.  Emesis Severity:  Moderate Duration:  3 days Timing:  Constant Progression:  Worsening Chronicity:  New Recent urination:  Normal Relieved by:  Nothing Worsened by:  Nothing Ineffective treatments:  None tried Associated symptoms: abdominal pain   Associated symptoms: no diarrhea       Home Medications Prior to Admission medications   Medication Sig Start Date End Date Taking? Authorizing Provider  budesonide-formoterol (SYMBICORT) 160-4.5 MCG/ACT inhaler Inhale 2 puffs into the lungs 2 (two) times daily. Patient taking differently: Inhale 1 puff into the lungs 2 (two) times daily. 08/05/18   Ripley Fraise, MD  hydrOXYzine (ATARAX) 25 MG tablet Take 25 mg by mouth 3 (three) times daily as needed.    [provider]      Allergies    Peanut oil, Pseudoephedrine hcl, and Sudafed [pseudoephedrine hcl]    Review of Systems   Review of Systems  Gastrointestinal:  Positive for abdominal pain and vomiting. Negative for diarrhea.  All other systems reviewed and are negative.  Physical Exam Updated Vital Signs BP (!) 157/97    Pulse 80    Temp 98.6 F (37 C) (Oral)    Resp 18    Ht 5' 3.5" (1.613 m)    Wt 92.7 kg    SpO2 100%    BMI 35.63 kg/m  Physical Exam Vitals and nursing note reviewed.  Constitutional:       General: She is not in acute distress.    Appearance: She is well-developed. She is not diaphoretic.  HENT:     Head: Normocephalic and atraumatic.  Cardiovascular:     Rate and Rhythm: Normal rate and regular rhythm.     Heart sounds: No murmur heard.   No friction rub. No gallop.  Pulmonary:     Effort: Pulmonary effort is normal. No respiratory distress.     Breath sounds: Normal breath sounds. No wheezing.  Abdominal:     General: Bowel sounds are normal. There is no distension.     Palpations: Abdomen is soft.     Tenderness: There is no abdominal tenderness.  Musculoskeletal:        General: Normal range of motion.     Cervical back: Normal range of motion and neck supple.  Skin:    General: Skin is warm and dry.  Neurological:     General: No focal deficit present.     Mental Status: She is alert and oriented to person, place, and time.    ED Results / Procedures / Treatments   Labs (all labs ordered are listed, but only abnormal results are displayed) Labs Reviewed  COMPREHENSIVE METABOLIC PANEL - Abnormal; Notable for the following components:      Result Value   Sodium 132 (*)    Chloride 97 (*)  Glucose, Bld 104 (*)    Total Protein 8.3 (*)    All other components within normal limits  CBC - Abnormal; Notable for the following components:   WBC 16.0 (*)    HCT 46.2 (*)    All other components within normal limits  URINALYSIS, ROUTINE W REFLEX MICROSCOPIC - Abnormal; Notable for the following components:   Ketones, ur TRACE (*)    Protein, ur TRACE (*)    All other components within normal limits  RESP PANEL BY RT-PCR (FLU A&B, COVID) ARPGX2  LIPASE, BLOOD  PREGNANCY, URINE    EKG None  Radiology No results found.  Procedures Procedures    Medications Ordered in ED Medications  sodium chloride 0.9 % bolus 1,000 mL (has no administration in time range)  ondansetron (ZOFRAN) injection 4 mg (has no administration in time range)  ketorolac  (TORADOL) 30 MG/ML injection 30 mg (has no administration in time range)    ED Course/ Medical Decision Making/ A&P  Patient presenting here with complaints of nausea and vomiting for the past several days.  She reports contact with coworkers who have similar symptoms.  I suspect her symptoms are related to an acute gastroenteritis.  Her abdomen is benign and ultrasound shows no evidence for cholecystitis or gallstones.  Laboratory studies are reassuring and patient feels better after receiving IV fluids and medications here in the ER.  I feel as though she can safely be discharged as she is tolerating oral liquids now.  She will be given Zofran tablets and is to follow-up as needed/return if she worsens.  Final Clinical Impression(s) / ED Diagnoses Final diagnoses:  Abdominal pain    Rx / DC Orders ED Discharge Orders     None         Veryl Speak, MD 02/07/22 [phone]

## 2022-02-07 MED ORDER — ONDANSETRON 8 MG PO TBDP: ORAL_TABLET | ORAL | 0 refills | Status: DC

## 2022-02-07 NOTE — Discharge Instructions (Addendum)
Begin taking Zofran as prescribed.  Clear liquid diet for the next 12 hours, then advance to normal as tolerated.  Return to the emergency department if you develop severe abdominal pain, bloody stools, high fevers, or other new and concerning symptoms.

## 2022-02-07 NOTE — ED Notes (Signed)
Pt tolerating PO intake with no reported difficulty  °

## 2022-02-11 ENCOUNTER — Encounter: Payer: Self-pay | Admitting: Nurse Practitioner

## 2022-02-11 ENCOUNTER — Other Ambulatory Visit: Payer: Self-pay

## 2022-02-11 ENCOUNTER — Ambulatory Visit (INDEPENDENT_AMBULATORY_CARE_PROVIDER_SITE_OTHER): Payer: Commercial Managed Care - PPO | Admitting: Nurse Practitioner

## 2022-02-11 ENCOUNTER — Other Ambulatory Visit (HOSPITAL_COMMUNITY)
Admission: RE | Admit: 2022-02-11 | Discharge: 2022-02-11 | Disposition: A | Discharge status: Home / Self Care | Payer: Commercial Managed Care - PPO | Source: Ambulatory Visit | Attending: Nurse Practitioner | Admitting: Nurse Practitioner

## 2022-02-11 VITALS — BP 124/88 | HR 100 | Temp 97.1°F | Ht 63.5 in | Wt 198.2 lb

## 2022-02-11 DIAGNOSIS — Z113 Encounter for screening for infections with a predominantly sexual mode of transmission: Secondary | ICD-10-CM | POA: Insufficient documentation

## 2022-02-11 DIAGNOSIS — Z124 Encounter for screening for malignant neoplasm of cervix: Secondary | ICD-10-CM | POA: Diagnosis not present

## 2022-02-11 DIAGNOSIS — A749 Chlamydial infection, unspecified: Secondary | ICD-10-CM

## 2022-02-11 DIAGNOSIS — J4520 Mild intermittent asthma, uncomplicated: Secondary | ICD-10-CM

## 2022-02-11 DIAGNOSIS — Z13220 Encounter for screening for lipoid disorders: Secondary | ICD-10-CM

## 2022-02-11 DIAGNOSIS — Z0001 Encounter for general adult medical examination with abnormal findings: Secondary | ICD-10-CM

## 2022-02-11 DIAGNOSIS — Z136 Encounter for screening for cardiovascular disorders: Secondary | ICD-10-CM | POA: Diagnosis not present

## 2022-02-11 DIAGNOSIS — A64 Unspecified sexually transmitted disease: Secondary | ICD-10-CM

## 2022-02-11 HISTORY — DX: Unspecified sexually transmitted disease: A64

## 2022-02-11 LAB — BASIC METABOLIC PANEL
BUN: 10 mg/dL (ref 6–23)
CO2: 31 mEq/L (ref 19–32)
Calcium: 9.4 mg/dL (ref 8.4–10.5)
Chloride: 102 mEq/L (ref 96–112)
Creatinine, Ser: 0.93 mg/dL (ref 0.40–1.20)
GFR: 80.81 mL/min (ref >60.00)
Glucose, Bld: 102 mg/dL — ABNORMAL HIGH (ref 70–99)
Potassium: 3.8 mEq/L (ref 3.5–5.1)
Sodium: 138 mEq/L (ref 135–145)

## 2022-02-11 LAB — CBC
HCT: 41.3 % (ref 36.0–46.0)
Hemoglobin: 13.1 g/dL (ref 12.0–15.0)
MCHC: 31.6 g/dL (ref 30.0–36.0)
MCV: 90.1 fl (ref 78.0–100.0)
Platelets: 343 10*3/uL (ref 150.0–400.0)
RBC: 4.58 Mil/uL (ref 3.87–5.11)
RDW: 13.4 % (ref 11.5–15.5)
WBC: 7.9 10*3/uL (ref 4.0–10.5)

## 2022-02-11 LAB — LIPID PANEL
Cholesterol: 163 mg/dL (ref 0–200)
HDL: 35.9 mg/dL — ABNORMAL LOW (ref >39.00)
LDL Cholesterol: 105 mg/dL — ABNORMAL HIGH (ref 0–99)
NonHDL: 127.12
Total CHOL/HDL Ratio: 5
Triglycerides: 111 mg/dL (ref 0.0–149.0)
VLDL: 22.2 mg/dL (ref 0.0–40.0)

## 2022-02-11 LAB — TSH: TSH: 0.81 u[IU]/mL (ref 0.35–5.50)

## 2022-02-11 NOTE — Progress Notes (Signed)
Subjective:    Patient ID: Teresa Peterson, female    DOB: 1986-05-04, 36 y.o.   MRN: [id number]  Patient presents today for CPE  HPI  Vision:up to date Dental:will schedule Diet:regular Exercise:none Weight:  Wt Readings from Last 3 Encounters:  02/11/22 198 lb 3.2 oz (89.9 kg)  02/06/22 204 lb 5.9 oz (92.7 kg)  12/26/21 204 lb 6.4 oz (92.7 kg)    Sexual History (orientation,birth control, marital status, STD):request for STD screen, she prefers to have pelvic and breast exam completed by GYN  Depression/Suicide: Depression screen [facility] 2/9 02/11/2022 12/26/2021  Decreased Interest 2 0  Down, Depressed, Hopeless 3 1  PHQ - 2 Score 5 1  Altered sleeping 3 3  Tired, decreased energy 3 3  Change in appetite 3 3  Feeling bad or failure about yourself  2 1  Trouble concentrating 2 2  Moving slowly or fidgety/restless 0 0  Suicidal thoughts 1 0  PHQ-9 Score 19 13  Difficult doing work/chores Somewhat difficult Somewhat difficult   GAD 7 : Generalized Anxiety Score 02/11/2022 12/26/2021  Nervous, Anxious, on Edge 3 0  Control/stop worrying 1 1  Worry too much - different things 3 2  Trouble relaxing 1 0  Restless 1 0  Easily annoyed or irritable 2 1  Afraid - awful might happen 0 1  Total GAD 7 Score 11 5  Anxiety Difficulty Somewhat difficult Somewhat difficult   Immunizations: (TDAP, Hep C screen, Pneumovax, Influenza, zoster)  Health Maintenance  Topic Date Due   Hepatitis C Screening: USPSTF Recommendation to screen - Ages 33-79 yo.  Never done   Pap Smear  06/30/2014   COVID-19 Vaccine (3 - Booster for Pfizer series) 02/23/2021   Flu Shot  03/22/2022*   Tetanus Vaccine  12/26/2022*   HIV Screening  Completed   HPV Vaccine  Aged Out  *Topic was postponed. The date shown is not the original due date.   Fall Risk: Fall Risk  12/26/2021  Falls in the past year? 0  Number falls in past yr: 0  Injury with Fall? 0  Risk for fall due to : No Fall Risks  Follow up  Falls evaluation completed   Medications and allergies reviewed with patient and updated if appropriate.  Patient Active Problem List   Diagnosis Date Noted   Asthma 11/08/2011   Tinea versicolor 11/08/2011   Current Outpatient Medications on File Prior to Visit  Medication Sig Dispense Refill   albuterol (VENTOLIN HFA) 108 (90 Base) MCG/ACT inhaler Inhale 1 puff into the lungs every 6 (six) hours as needed.     budesonide-formoterol (SYMBICORT) 160-4.5 MCG/ACT inhaler Inhale 2 puffs into the lungs 2 (two) times daily. (Patient taking differently: Inhale 1 puff into the lungs 2 (two) times daily.) 1 Inhaler 12   hydrOXYzine (ATARAX) 25 MG tablet Take 25 mg by mouth 3 (three) times daily as needed.     ondansetron (ZOFRAN-ODT) 8 MG disintegrating tablet 8mg  ODT q4 hours prn nausea 10 tablet 0   valACYclovir (VALTREX) 1000 MG tablet Take 1,000 mg by mouth 2 (two) times daily.     No current facility-administered medications on file prior to visit.   Past Medical History:  Diagnosis Date   Allergy    Anemia 08/15/88   Born anemic, grew out of it   Asthma    Depression    Emphysema of lung (HCC)    GERD (gastroesophageal reflux disease)    Headache(784.0)    Migraines  Heart murmur    Noncompliance with medications    History reviewed. No pertinent surgical history.  Social History   Socioeconomic History   Marital status: Single    Spouse name: Not on file   Number of children: Not on file   Years of education: Not on file   Highest education level: Not on file  Occupational History   Not on file  Tobacco Use   Smoking status: Former    Types: Cigars   Smokeless tobacco: Never   Tobacco comments:    I went cold [place] in 2012 for 3 years. 2015-2022 tired to stop countless times.  Vaping Use   Vaping Use: Never used  Substance and Sexual Activity   Alcohol use: Yes    Comment: occ   Drug use: Yes    Types: Marijuana   Sexual activity: Yes    Birth  control/protection: None    Comment: Would like to see about being put back on  Other Topics Concern   Not on file  Social History Narrative   Alcoholic beverage: Yes      Drug use: No stopped 06/2011      Seatbeat Use: yes      Firearms in home: no      Exercise: no      Smoke Alarm in your home: Yes                  Social Determinants of Health   Financial Resource Strain: Not on file  Food Insecurity: Not on file  Transportation Needs: Not on file  Physical Activity: Not on file  Stress: Not on file  Social Connections: Not on file   Family History  Problem Relation Age of Onset   Alcohol abuse Other        grandparents   Arthritis Other        grandparent   Cancer Other        relative   Stroke Other        grandparent   Kidney disease Other        grandparents   Diabetes Other        grandparent   Asthma Brother    Asthma Mother    Diabetes Mother    Diabetes Maternal Grandfather    Kidney disease Maternal Grandfather    Asthma Maternal Grandmother    Stroke Maternal Grandmother    Asthma Paternal Grandmother    Asthma Maternal Aunt    Diabetes Maternal Aunt    Hypertension Maternal Aunt         Review of Systems  Constitutional:  Negative for fever, malaise/fatigue and weight loss.  HENT:  Negative for congestion and sore throat.   Eyes:        Negative for visual changes  Respiratory:  Negative for cough and shortness of breath.   Cardiovascular:  Negative for chest pain, palpitations and leg swelling.  Gastrointestinal:  Negative for blood in stool, constipation, diarrhea and heartburn.  Genitourinary:  Negative for dysuria, frequency and urgency.  Musculoskeletal:  Negative for falls, joint pain and myalgias.  Skin:  Negative for rash.  Neurological:  Negative for dizziness, sensory change and headaches.  Endo/Heme/Allergies:  Does not bruise/bleed easily.  Psychiatric/Behavioral:  Negative for depression, substance abuse and suicidal  ideas. The patient is not nervous/anxious.    Objective:   Vitals:   02/11/22 0827  BP: 124/88  Pulse: 100  Temp: (!) 97.1 F (36.2 C)  SpO2: 98%  Body mass index is 34.56 kg/m.   Physical Examination:  Physical Exam Vitals reviewed.  Constitutional:      General: She is not in acute distress.    Appearance: She is well-developed. She is obese.  HENT:     Right Ear: Tympanic membrane, ear canal and external ear normal.     Left Ear: Tympanic membrane, ear canal and external ear normal.  Eyes:     Extraocular Movements: Extraocular movements intact.     Conjunctiva/sclera: Conjunctivae normal.  Cardiovascular:     Rate and Rhythm: Normal rate and regular rhythm.     Pulses: Normal pulses.     Heart sounds: Normal heart sounds.  Pulmonary:     Effort: Pulmonary effort is normal. No respiratory distress.     Breath sounds: Normal breath sounds.  Chest:     Chest wall: No tenderness.  Abdominal:     General: Bowel sounds are normal.     Palpations: Abdomen is soft.  Genitourinary:    Comments: Deferred breast and pelvic exam to GYN Musculoskeletal:        General: Normal range of motion.     Cervical back: Normal range of motion and neck supple.     Right lower leg: No edema.     Left lower leg: No edema.  Lymphadenopathy:     Cervical: No cervical adenopathy.  Skin:    General: Skin is warm and dry.  Neurological:     Mental Status: She is alert and oriented to person, place, and time.     Deep Tendon Reflexes: Reflexes are normal and symmetric.  Psychiatric:        Mood and Affect: Mood normal.        Behavior: Behavior normal.        Thought Content: Thought content normal.   ASSESSMENT and PLAN: This visit occurred during the SARS-CoV-2 public health emergency.  Safety protocols were in place, including screening questions prior to the visit, additional usage of staff PPE, and extensive cleaning of exam room while observing appropriate contact time as  indicated for disinfecting solutions.   Terri was seen today for annual exam.  Diagnoses and all orders for this visit:  Encounter for preventative adult health care exam with abnormal findings -     Lipid panel -     CBC -     Basic metabolic panel -     TSH  Encounter for Papanicolaou smear for cervical cancer screening -     Ambulatory referral to Gynecology  Encounter for lipid screening for cardiovascular disease -     Lipid panel  Screen for STD (sexually transmitted disease) -     Hepatitis C Antibody -     RPR -     Urine cytology ancillary only([place]) -     HIV antibody (with reflex)  Mild intermittent asthma without complication      Problem List Items Addressed This Visit       Respiratory   Asthma    Controlled with use of symbicort daily and albuterol prn She needs a work letter to avoid pet dander at work. Letter provided      Relevant Medications   albuterol (VENTOLIN HFA) 108 (90 Base) MCG/ACT inhaler   Other Visit Diagnoses     Encounter for preventative adult health care exam with abnormal findings    -  Primary   Relevant Orders   Lipid panel   CBC   Basic metabolic panel  TSH   Encounter for Papanicolaou smear for cervical cancer screening       Relevant Orders   Ambulatory referral to Gynecology   Encounter for lipid screening for cardiovascular disease       Relevant Orders   Lipid panel   Screen for STD (sexually transmitted disease)       Relevant Orders   Hepatitis C Antibody   RPR   Urine cytology ancillary only([place])   HIV antibody (with reflex)       Follow up: Return in about 4 weeks (around 03/11/2022) for address PHQ/GAD form.  Alysia Penna, NP

## 2022-02-11 NOTE — Assessment & Plan Note (Signed)
Controlled with use of symbicort daily and albuterol prn She needs a work letter to avoid pet dander at work. Letter provided

## 2022-02-11 NOTE — Patient Instructions (Signed)
Go to lab for blood draw and urine collection.  Maintain heart healthy diet and regular exercise.  You will be contacted to schedule appt with GYN  Preventive Care 55-34 Years Old, Female Preventive care refers to lifestyle choices and visits with your health care provider that can promote health and wellness. Preventive care visits are also called wellness exams. What can I expect for my preventive care visit? Counseling During your preventive care visit, your health care provider may ask about your: Medical history, including: Past medical problems. Family medical history. Pregnancy history. Current health, including: Menstrual cycle. Method of birth control. Emotional well-being. Home life and relationship well-being. Sexual activity and sexual health. Lifestyle, including: Alcohol, nicotine or tobacco, and drug use. Access to firearms. Diet, exercise, and sleep habits. Work and work Statistician. Sunscreen use. Safety issues such as seatbelt and bike helmet use. Physical exam Your health care provider may check your: Height and weight. These may be used to calculate your BMI (body mass index). BMI is a measurement that tells if you are at a healthy weight. Waist circumference. This measures the distance around your waistline. This measurement also tells if you are at a healthy weight and may help predict your risk of certain diseases, such as type 2 diabetes and high blood pressure. Heart rate and blood pressure. Body temperature. Skin for abnormal spots. What immunizations do I need? Vaccines are usually given at various ages, according to a schedule. Your health care provider will recommend vaccines for you based on your age, medical history, and lifestyle or other factors, such as travel or where you work. What tests do I need? Screening Your health care provider may recommend screening tests for certain conditions. This may include: Pelvic exam and Pap test. Lipid and  cholesterol levels. Diabetes screening. This is done by checking your blood sugar (glucose) after you have not eaten for a while (fasting). Hepatitis B test. Hepatitis C test. HIV (human immunodeficiency virus) test. STI (sexually transmitted infection) testing, if you are at risk. BRCA-related cancer screening. This may be done if you have a family history of breast, ovarian, tubal, or peritoneal cancers. Talk with your health care provider about your test results, treatment options, and if necessary, the need for more tests. Follow these instructions at home: Eating and drinking  Eat a healthy diet that includes fresh fruits and vegetables, whole grains, lean protein, and low-fat dairy products. Take vitamin and mineral supplements as recommended by your health care provider. Do not drink alcohol if: Your health care provider tells you not to drink. You are pregnant, may be pregnant, or are planning to become pregnant. If you drink alcohol: Limit how much you have to 0-1 drink a day. Know how much alcohol is in your drink. In the [place] one drink equals one 12 oz bottle of beer (355 mL), one 5 oz glass of wine (148 mL), or one 1 oz glass of hard liquor (44 mL). Lifestyle Brush your teeth every morning and night with fluoride toothpaste. Floss one time each day. Exercise for at least 30 minutes 5 or more days each week. Do not use any products that contain nicotine or tobacco. These products include cigarettes, chewing tobacco, and vaping devices, such as e-cigarettes. If you need help quitting, ask your health care provider. Do not use drugs. If you are sexually active, practice safe sex. Use a condom or other form of protection to prevent STIs. If you do not wish to become pregnant, use a form  of birth control. If you plan to become pregnant, see your health care provider for a prepregnancy visit. °Find healthy ways to manage stress, such as: °Meditation, yoga, or listening to  music. °Journaling. °Talking to a trusted person. °Spending time with friends and family. °Minimize exposure to UV radiation to reduce your risk of skin cancer. °Safety °Always wear your seat belt while driving or riding in a vehicle. °Do not drive: °If you have been drinking alcohol. Do not ride with someone who has been drinking. °If you have been using any mind-altering substances or drugs. °While texting. °When you are tired or distracted. °Wear a helmet and other protective equipment during sports activities. °If you have firearms in your house, make sure you follow all gun safety procedures. °Seek help if you have been physically or sexually abused. °What's next? °Go to your health care provider once a year for an annual wellness visit. °Ask your health care provider how often you should have your eyes and teeth checked. °Stay up to date on all vaccines. °This information is not intended to replace advice given to you by your health care provider. Make sure you discuss any questions you have with your health care provider. °Document Revised: 06/06/2021 Document Reviewed: 06/06/2021 °Elsevier Patient Education © 2022 [employer] ° °

## 2022-02-12 LAB — RPR: RPR Ser Ql: NONREACTIVE

## 2022-02-12 LAB — URINE CYTOLOGY ANCILLARY ONLY
Chlamydia: POSITIVE — AB
Comment: NEGATIVE
Comment: NEGATIVE
Comment: NORMAL
Neisseria Gonorrhea: NEGATIVE
Trichomonas: NEGATIVE

## 2022-02-12 LAB — HIV ANTIBODY (ROUTINE TESTING W REFLEX): HIV 1&2 Ab, 4th Generation: NONREACTIVE

## 2022-02-12 LAB — HEPATITIS C ANTIBODY
Hepatitis C Ab: NONREACTIVE
SIGNAL TO CUT-OFF: 0.09 (ref <1.00)

## 2022-02-13 ENCOUNTER — Telehealth: Payer: Self-pay | Admitting: Nurse Practitioner

## 2022-02-13 MED ORDER — AZITHROMYCIN 500 MG PO TABS: 1000.0000 mg | ORAL_TABLET | Freq: Once | ORAL | 0 refills | Status: AC

## 2022-02-13 NOTE — Telephone Encounter (Signed)
Pt would like a callback about lab results once they are reviewed, callback [phone]

## 2022-02-13 NOTE — Addendum Note (Signed)
Addended by: Leana Gamer on: 02/13/2022 08:31 AM   Modules accepted: Orders

## 2022-02-19 ENCOUNTER — Other Ambulatory Visit: Payer: Commercial Managed Care - PPO

## 2022-02-19 ENCOUNTER — Encounter: Payer: Self-pay | Admitting: Obstetrics and Gynecology

## 2022-02-19 ENCOUNTER — Other Ambulatory Visit (HOSPITAL_COMMUNITY)
Admission: RE | Admit: 2022-02-19 | Discharge: 2022-02-19 | Disposition: A | Discharge status: Home / Self Care | Payer: Commercial Managed Care - PPO | Source: Ambulatory Visit | Attending: Nurse Practitioner | Admitting: Nurse Practitioner

## 2022-02-19 ENCOUNTER — Ambulatory Visit (INDEPENDENT_AMBULATORY_CARE_PROVIDER_SITE_OTHER): Payer: Commercial Managed Care - PPO | Admitting: Obstetrics and Gynecology

## 2022-02-19 ENCOUNTER — Other Ambulatory Visit: Payer: Self-pay

## 2022-02-19 VITALS — BP 122/82 | HR 92 | Ht 62.5 in | Wt 199.0 lb

## 2022-02-19 DIAGNOSIS — A749 Chlamydial infection, unspecified: Secondary | ICD-10-CM | POA: Insufficient documentation

## 2022-02-19 DIAGNOSIS — Z8619 Personal history of other infectious and parasitic diseases: Secondary | ICD-10-CM | POA: Diagnosis not present

## 2022-02-19 DIAGNOSIS — L680 Hirsutism: Secondary | ICD-10-CM

## 2022-02-19 DIAGNOSIS — A6000 Herpesviral infection of urogenital system, unspecified: Secondary | ICD-10-CM

## 2022-02-19 DIAGNOSIS — Z319 Encounter for procreative management, unspecified: Secondary | ICD-10-CM | POA: Diagnosis not present

## 2022-02-19 DIAGNOSIS — L709 Acne, unspecified: Secondary | ICD-10-CM | POA: Diagnosis not present

## 2022-02-19 NOTE — Progress Notes (Signed)
GYNECOLOGY  VISIT   HPI: 36 y.o.   Single  African American  female   [id number] with Patient's last menstrual period was 02/08/2022 (exact date).   here to be evaluated for PCOS. She was told 2 years ago she had PCOS. She also wants to discuss birth control.  Used progesterone only birth control in the past due to elevated blood pressure.  [facility]x with [facility] through [facility] 3 years ago. Her presenting symptoms were cramps, nausea, irregular menses, increased hair grown on her chin.  She shaves her chin and has acne. She thinks her testosterone and A1C were elevated and took Metformin the past.  Has not had a sonogram.  Currently has monthly menses.  She tested positive for chlamydia on 02-11-22 and treated--in [facility]. She had a repeat test today through her PCP.   She has herpes type II and asking about diet for reducing outbreaks.   She is considering childbearing soon.   Wants to loose weight.   GYNECOLOGIC HISTORY: Patient's last menstrual period was 02/08/2022 (exact date). Contraception:  none Menopausal hormone therapy:  n/a Last mammogram:  n/a Last pap smear:  2020 normal per patient. Never had an abnormal pap.        OB History     Gravida  0   Para  0   Term  0   Preterm  0   AB  0   Living  0      SAB  0   IAB  0   Ectopic  0   Multiple  0   Live Births  0              Patient Active Problem List   Diagnosis Date Noted   Asthma 11/08/2011   Tinea versicolor 11/08/2011    Past Medical History:  Diagnosis Date   Allergy    Anemia 10/26/1988   Born anemic, grew out of it   Asthma    Depression    Emphysema of lung (HCC)    GERD (gastroesophageal reflux disease)    Headache(784.0)    Migraines w/Aura   Heart murmur    HSV infection    Hx of vaginal herpes   Noncompliance with medications    STD (sexually transmitted disease) 02/11/2022   Tx'd for chlamydia x3    History reviewed. No pertinent surgical history.  Current  Outpatient Medications  Medication Sig Dispense Refill   albuterol (VENTOLIN HFA) 108 (90 Base) MCG/ACT inhaler Inhale 1 puff into the lungs every 6 (six) hours as needed.     azithromycin (ZITHROMAX) 500 MG tablet Take 1,000 mg by mouth once.     budesonide-formoterol (SYMBICORT) 160-4.5 MCG/ACT inhaler Inhale 2 puffs into the lungs 2 (two) times daily. (Patient taking differently: Inhale 1 puff into the lungs 2 (two) times daily.) 1 Inhaler 12   hydrOXYzine (ATARAX) 25 MG tablet Take 25 mg by mouth 3 (three) times daily as needed.     triamcinolone (NASACORT) 55 MCG/ACT AERO nasal inhaler USE 1 SPRAY(S) IN EACH NOSTRIL AS DIRECTED AS NEEDED     valACYclovir (VALTREX) 1000 MG tablet Take 1,000 mg by mouth 2 (two) times daily.     No current facility-administered medications for this visit.     ALLERGIES: Peanut oil, Pseudoephedrine hcl, and Sudafed [pseudoephedrine hcl]  Family History  Problem Relation Age of Onset   Asthma Mother    Diabetes Mother    Asthma Brother    Asthma Maternal Aunt  Diabetes Maternal Aunt    Hypertension Maternal Aunt    Asthma Maternal Grandmother    Stroke Maternal Grandmother    Diabetes Maternal Grandfather    Kidney disease Maternal Grandfather    Asthma Paternal Grandmother    Alcohol abuse Other        grandparents   Arthritis Other        grandparent   Cancer Other        relative   Stroke Other        grandparent   Kidney disease Other        grandparents   Diabetes Other        grandparent    Social History   Socioeconomic History   Marital status: Single    Spouse name: Not on file   Number of children: Not on file   Years of education: Not on file   Highest education level: Not on file  Occupational History   Not on file  Tobacco Use   Smoking status: Former    Types: Cigars   Smokeless tobacco: Never   Tobacco comments:    I went cold [place] in 2012 for 3 years. 2015-2022 tired to stop countless times.  Vaping Use    Vaping Use: Never used  Substance and Sexual Activity   Alcohol use: Yes    Comment: 1 bottle of wine/month   Drug use: Yes    Frequency: 7.0 times per week    Types: Marijuana   Sexual activity: Yes    Birth control/protection: None    Comment: Would like to see about being put back on  Other Topics Concern   Not on file  Social History Narrative   Alcoholic beverage: Yes      Drug use: No stopped 06/2011      Seatbeat Use: yes      Firearms in home: no      Exercise: no      Smoke Alarm in your home: Yes                  Social Determinants of Health   Financial Resource Strain: Not on file  Food Insecurity: Not on file  Transportation Needs: Not on file  Physical Activity: Not on file  Stress: Not on file  Social Connections: Not on file  Intimate Partner Violence: Not on file    Review of Systems  All other systems reviewed and are negative.  PHYSICAL EXAMINATION:    BP 122/82    Pulse 92    Ht 5' 2.5" (1.588 m)    Wt 199 lb (90.3 kg)    LMP 02/08/2022 (Exact Date)    SpO2 97%    BMI 35.82 kg/m     General appearance: alert, cooperative and appears stated age Head: Normocephalic, without obvious abnormality, atraumatic Neck: no adenopathy, supple, symmetrical, trachea midline and thyroid normal to inspection and palpation Lungs: clear to auscultation bilaterally Breasts: normal appearance, no masses or tenderness, No nipple retraction or dimpling, No nipple discharge or bleeding, No axillary or supraclavicular adenopathy Heart: regular rate and rhythm Abdomen: soft, non-tender, no masses,  no organomegaly Extremities: extremities normal, atraumatic, no cyanosis or edema Skin: Skin color, texture, turgor normal. No rashes or lesions Lymph nodes: Cervical, supraclavicular, and axillary nodes normal. No abnormal inguinal nodes palpated Neurologic: Grossly normal  Pelvic: External genitalia:  no lesions              Urethra:  normal appearing urethra with  no masses, tenderness or lesions              Bartholins and Skenes: normal                 Vagina: normal appearing vagina with normal color and discharge, no lesions              Cervix: no lesions                Bimanual Exam:  Uterus:  normal size, contour, position, consistency, mobility, non-tender              Adnexa: no mass, fullness, tenderness           Chaperone was present for exam:  yes.  ASSESSMENT  Hx PCOS.  Hirsutism. Acne. Desire for pregnancy. Recent chlamydia.  Migraine with aura.  Herpes infection.   PLAN  We discussed PCOS and hirsutism evaluation.  Will check 17 OH-P, cortisol, A1C, prolactin, and testosterone levels.  [place] not ordered at this time.  We reviewed test of cure for chlamydia 1 month following diagnosis and retesting in 3 months to rule out re-infection. I discussed increased risk of ectopic pregnancy due to history of ectopic pregnancy.  Early monitoring of future pregnancy recommended. PNV recommended.  Avoidance of exposures reviewed:  tobacco, ETOH, marijuana, unnecessary medications, changing cat litter boxes.  What to Expect Before Expecting recommended for pregnancy preparation.  Combined oral contraception not recommended for pregnancy prevention. Reduction of herpes infection through stress reduction and antiviral medication.    An After Visit Summary was printed and given to the patient.

## 2022-02-20 LAB — URINE CYTOLOGY ANCILLARY ONLY
Chlamydia: NEGATIVE
Comment: NEGATIVE
Comment: NEGATIVE
Comment: NORMAL
Neisseria Gonorrhea: NEGATIVE
Trichomonas: NEGATIVE

## 2022-02-20 LAB — CORTISOL: Cortisol, Plasma: 3 ug/dL

## 2022-02-26 LAB — PROLACTIN: Prolactin: 7.5 ng/mL

## 2022-02-26 LAB — HEMOGLOBIN A1C
Hgb A1c MFr Bld: 5.6 %{Hb}
Mean Plasma Glucose: 114 mg/dL
eAG (mmol/L): 6.3 mmol/L

## 2022-02-26 LAB — TESTOS,TOTAL,FREE AND SHBG (FEMALE)
Free Testosterone: 7 pg/mL — ABNORMAL HIGH (ref 0.1–6.4)
Sex Hormone Binding: 22 nmol/L (ref 17–124)
Testosterone, Total, LC-MS-MS: 37 ng/dL (ref 2–45)

## 2022-02-26 LAB — 17-HYDROXYPROGESTERONE: 17-OH-Progesterone, LC/MS/MS: 79 ng/dL

## 2022-03-11 ENCOUNTER — Ambulatory Visit: Payer: Commercial Managed Care - PPO | Admitting: Nurse Practitioner

## 2022-03-22 ENCOUNTER — Ambulatory Visit: Payer: Commercial Managed Care - PPO | Admitting: Nurse Practitioner

## 2022-03-22 ENCOUNTER — Encounter: Payer: Self-pay | Admitting: Nurse Practitioner

## 2022-03-22 VITALS — BP 128/84 | HR 74 | Temp 97.9°F | Ht 63.5 in | Wt 200.2 lb

## 2022-03-22 DIAGNOSIS — F333 Major depressive disorder, recurrent, severe with psychotic symptoms: Secondary | ICD-10-CM | POA: Diagnosis not present

## 2022-03-22 DIAGNOSIS — M778 Other enthesopathies, not elsewhere classified: Secondary | ICD-10-CM | POA: Diagnosis not present

## 2022-03-22 HISTORY — DX: Other enthesopathies, not elsewhere classified: M77.8

## 2022-03-22 MED ORDER — IBUPROFEN 600 MG PO TABS: 600.0000 mg | ORAL_TABLET | Freq: Three times a day (TID) | ORAL | 0 refills | Status: DC | PRN

## 2022-03-22 MED ORDER — BUPROPION HCL ER (XL) 150 MG PO TB24: 150.0000 mg | ORAL_TABLET | Freq: Every day | ORAL | 5 refills | Status: DC

## 2022-03-22 NOTE — Assessment & Plan Note (Addendum)
Chronic, waxing and waning, right hand dominant. 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 ?Previous completion of outpt PT with some improvement. ?Normal shoulder ROM and no weakness, intermittent swelling with over use and pain radiates to right wrist. ? ?Ibuprofen sent ?Entered referral to ortho ?
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
## 2022-03-22 NOTE — Patient Instructions (Addendum)
You will be contacted to schedule appt with psychiatry, psychology, and ortho. ?Start wellbutrin and ibuprofen as prescribed ?Resume should exercise daily ?Alternate between warm and cold compress as needed ? ?Managing Depression, Adult ?Depression is a mental health condition that affects your thoughts, feelings, and actions. Being diagnosed with depression can bring you relief if you did not know why you have felt or behaved a certain way. It could also leave you feeling overwhelmed with uncertainty about your future. Preparing yourself to manage your symptoms can help you feel more positive about your future. ?How to manage lifestyle changes ?Managing stress ?Stress is your body's reaction to life changes and events, both good and bad. Stress can add to your feelings of depression. Learning to manage your stress can help lessen your feelings of depression. ?Try some of the following approaches to reducing your stress (stress reduction techniques): ?Listen to music that you enjoy and that inspires you. ?Try using a meditation app or take a meditation class. ?Develop a practice that helps you connect with your spiritual self. Walk in nature, pray, or go to a place of worship. ?Do some deep breathing. To do this, inhale slowly through your nose. Pause at the top of your inhale for a few seconds and then exhale slowly, letting your muscles relax. ?Practice yoga to help relax and work your muscles. ?Choose a stress reduction technique that suits your lifestyle and personality. These techniques take time and practice to develop. Set aside 5-15 minutes a day to do them. Therapists can offer training in these techniques. Other things you can do to manage stress include: ?Keeping a stress diary. ?Knowing your limits and saying no when you think something is too much. ?Paying attention to how you react to certain situations. You may not be able to control everything, but you can change your reaction. ?Adding humor to your  life by watching funny films or TV shows. ?Making time for activities that you enjoy and that relax you. ? ?Medicines ?Medicines, such as antidepressants, are often a part of treatment for depression. ?Talk with your pharmacist or health care provider about all the medicines, supplements, and herbal products that you take, their possible side effects, and what medicines and other products are safe to take together. ?Make sure to report any side effects you may have to your health care provider. ?Relationships ?Your health care provider may suggest family therapy, couples therapy, or individual therapy as part of your treatment. ?How to recognize changes ?Everyone responds differently to treatment for depression. As you recover from depression, you may start to: ?Have more interest in doing activities. ?Feel less hopeless. ?Have more energy. ?Overeat less often, or have a better appetite. ?Have better mental focus. ?It is important to recognize if your depression is not getting better or is getting worse. The symptoms you had in the beginning may return, such as: ?Tiredness (fatigue) or low energy. ?Eating too much or too little. ?Sleeping too much or too little. ?Feeling restless, agitated, or hopeless. ?Trouble focusing or making decisions. ?Unexplained physical complaints. ?Feeling irritable, angry, or aggressive. ?If you or your family members notice these symptoms coming back, let your health care provider know right away. ?Follow these instructions at home: ?Activity ? ?Try to get some form of exercise each day, such as walking, biking, swimming, or lifting weights. ?Practice stress reduction techniques. ?Engage your mind by taking a class or doing some volunteer work. ?Lifestyle ?Get the right amount and quality of sleep. ?Cut down on using  caffeine, tobacco, alcohol, and other potentially harmful substances. ?Eat a healthy diet that includes plenty of vegetables, fruits, whole grains, low-fat dairy products,  and lean protein. Do not eat a lot of foods that are high in solid fats, added sugars, or salt (sodium). ?General instructions ?Take over-the-counter and prescription medicines only as told by your health care provider. ?Keep all follow-up visits as told by your health care provider. This is important. ?Where to find support ?Talking to others ?Friends and family members can be sources of support and guidance. Talk to trusted friends or family members about your condition. Explain your symptoms to them, and let them know that you are working with a health care provider to treat your depression. Tell friends and family members how they also can be helpful. ?Finances ?Find appropriate mental health providers that fit with your financial situation. ?Talk with your health care provider about options to get reduced prices on your medicines. ?Where to find more information ?You can find support in your area from: ?Anxiety and Depression Association of America (ADAA): ProgramCam.de ?Mental Health America: www.mentalhealthamerica.net ?National Alliance on Mental Illness: www.nami.org ?Contact a health care provider if: ?You stop taking your antidepressant medicines, and you have any of these symptoms: ?Nausea. ?Headache. ?Light-headedness. ?Chills and body aches. ?Not being able to sleep (insomnia). ?You or your friends and family think your depression is getting worse. ?Get help right away if: ?You have thoughts of hurting yourself or others. ?If you ever feel like you may hurt yourself or others, or have thoughts about taking your own life, get help right away. Go to your nearest emergency department or: ?Call your local emergency services (911 in the [place] ?Call a suicide crisis helpline, such as the National Suicide Prevention Lifeline at 517-674-5547 or 988 in the [place] This is open 24 hours a day in the [place] ?Text the Crisis Text Line at 757 442 5739 (in the [place] ?Summary ?If you are diagnosed with depression, preparing  yourself to manage your symptoms is a good way to feel positive about your future. ?Work with your health care provider on a management plan that includes stress reduction techniques, medicines (if applicable), therapy, and healthy lifestyle habits. ?Keep talking with your health care provider about how your treatment is working. ?If you have thoughts about taking your own life, call a suicide crisis helpline or text a crisis text line. ?This information is not intended to replace advice given to you by your health care provider. Make sure you discuss any questions you have with your health care provider. ?Document Revised: 07/04/2021 Document Reviewed: 10/20/2019 ?Elsevier Patient Education ? 2022 Elsevier Inc. ? ?

## 2022-04-09 ENCOUNTER — Ambulatory Visit (INDEPENDENT_AMBULATORY_CARE_PROVIDER_SITE_OTHER): Payer: Commercial Managed Care - PPO

## 2022-04-09 ENCOUNTER — Other Ambulatory Visit: Payer: Self-pay

## 2022-04-09 ENCOUNTER — Ambulatory Visit (INDEPENDENT_AMBULATORY_CARE_PROVIDER_SITE_OTHER): Payer: Commercial Managed Care - PPO | Admitting: Orthopaedic Surgery

## 2022-04-09 ENCOUNTER — Encounter: Payer: Self-pay | Admitting: Orthopaedic Surgery

## 2022-04-09 VITALS — Ht 63.0 in | Wt 195.0 lb

## 2022-04-09 DIAGNOSIS — M25511 Pain in right shoulder: Secondary | ICD-10-CM

## 2022-04-09 DIAGNOSIS — G8929 Other chronic pain: Secondary | ICD-10-CM | POA: Diagnosis not present
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
## 2022-04-22 ENCOUNTER — Ambulatory Visit: Payer: Commercial Managed Care - PPO | Admitting: Nurse Practitioner

## 2022-04-22 ENCOUNTER — Telehealth: Payer: Self-pay | Admitting: Nurse Practitioner

## 2022-04-22 NOTE — Telephone Encounter (Signed)
Patient/Caregiver was notified of No Show/Late Cancellation Policy & possible $50 charge. ?Visit was cancelled with reason "No Show/Cancel within 24 hours" for tracking & charging. ? ?Date of APPT: 04/22/22 ?Reason given for no show/late cancellation: NA ?No Show Letter printed & put in outgoing mail (Yes/No): yes ? ?~~~Route message to admin supervisor and clinical team/CMA~~~ ? ?  ?

## 2022-04-26 ENCOUNTER — Other Ambulatory Visit: Payer: Commercial Managed Care - PPO

## 2022-05-02 NOTE — Telephone Encounter (Signed)
1st no show, prior late cancel due to "car won't start" ?Cannot charge due to having medicaid ?

## 2022-05-29 ENCOUNTER — Ambulatory Visit
Admission: EM | Admit: 2022-05-29 | Discharge: 2022-05-29 | Disposition: A | Discharge status: Home / Self Care | Payer: Medicaid Other | Attending: Nurse Practitioner | Admitting: Nurse Practitioner

## 2022-05-29 DIAGNOSIS — R062 Wheezing: Secondary | ICD-10-CM

## 2022-05-29 DIAGNOSIS — T7840XA Allergy, unspecified, initial encounter: Secondary | ICD-10-CM

## 2022-05-29 DIAGNOSIS — Z8709 Personal history of other diseases of the respiratory system: Secondary | ICD-10-CM

## 2022-05-29 DIAGNOSIS — R0789 Other chest pain: Secondary | ICD-10-CM

## 2022-05-29 MED ORDER — DEXAMETHASONE SODIUM PHOSPHATE 10 MG/ML IJ SOLN
10.0000 mg | Freq: Once | INTRAMUSCULAR | Status: AC
  Administered 2022-05-29: 10 mg via INTRAMUSCULAR

## 2022-05-29 MED ORDER — ALBUTEROL SULFATE HFA 108 (90 BASE) MCG/ACT IN AERS
2.0000 | INHALATION_SPRAY | Freq: Four times a day (QID) | RESPIRATORY_TRACT | Status: DC | PRN
  Administered 2022-05-29: 2 via RESPIRATORY_TRACT

## 2022-05-29 MED ORDER — FAMOTIDINE 20 MG PO TABS: 20.0000 mg | ORAL_TABLET | Freq: Two times a day (BID) | ORAL | 0 refills | Status: DC

## 2022-05-29 MED ORDER — FEXOFENADINE HCL 60 MG PO TABS: 60.0000 mg | ORAL_TABLET | Freq: Every day | ORAL | 0 refills | Status: DC

## 2022-05-29 MED ORDER — ALBUTEROL SULFATE (2.5 MG/3ML) 0.083% IN NEBU
2.5000 mg | INHALATION_SOLUTION | Freq: Once | RESPIRATORY_TRACT | Status: AC
  Administered 2022-05-29: 2.5 mg via RESPIRATORY_TRACT

## 2022-05-29 NOTE — Discharge Instructions (Addendum)
-   We have given you a steroid shot today to help correct the allergic reaction along with an albuterol breathing treatment -Please use the albuterol inhaler every 4-6 hours as needed for wheezing, chest tightness, shortness of breath -Please start on a daily oral antihistamine like Allegra -Please also start the Pepcid 20 mg twice daily to help with the allergic response - Seek care in the emergency room if your symptoms worsen

## 2022-05-29 NOTE — ED Triage Notes (Signed)
Pt states that yesterday she was around some lavender and she is allergic to lavender  Pt states she felt bad afterwards and was wheezing with chest tightness  Pt states she can feel the tightness in her back  Pt states she has an itchy throat  Denies Fever  Pt states she used her Symbicort inhaler without much relief

## 2022-05-29 NOTE — ED Provider Notes (Signed)
[facility]    CSN: [id number] Arrival date & time: 05/29/22  1413      History   Chief Complaint Chief Complaint  Patient presents with   Shortness of Breath    Chest tightness, SOB ( Hx of asthma )    HPI Teresa Peterson is a 36 y.o. female.   Patient presents for an allergic reaction.  Reports she is allergic to lavender, yesterday was exposed to lavender cleaning product while at work and immediately felt tongue itching and chest tightness.  Reports she took Symbicort inhaler at home but this has not helped.  She lost albuterol inhaler.   Patient denies throat or tongue swelling, however does feel her tongue is itchy.  She also endorses chest tightness, however denies chest pain or shortness of breath.  She denies any rash.  She reports this is the typical symptoms she has been exposed to lavender.   Past Medical History:  Diagnosis Date   Allergy    Anemia 11-26-88   Born anemic, grew out of it   Asthma    Depression    Emphysema of lung (HCC)    GERD (gastroesophageal reflux disease)    Headache(784.0)    Migraines w/Aura   Heart murmur    HSV infection    Hx of vaginal herpes   Noncompliance with medications    STD (sexually transmitted disease) 02/11/2022   Tx'd for chlamydia x3    Patient Active Problem List   Diagnosis Date Noted   Severe episode of recurrent major depressive disorder, with psychotic features (HCC) 03/22/2022   Right shoulder tendonitis 03/22/2022   Asthma 11/08/2011   Tinea versicolor 11/08/2011    History reviewed. No pertinent surgical history.  OB History     Gravida  0   Para  0   Term  0   Preterm  0   AB  0   Living  0      SAB  0   IAB  0   Ectopic  0   Multiple  0   Live Births  0            Home Medications    Prior to Admission medications   Medication Sig Start Date End Date Taking? Authorizing Provider  famotidine (PEPCID) 20 MG tablet Take 1 tablet (20 mg total) by mouth 2  (two) times daily. 05/29/22  Yes Valentino NoseMartinez, Jaikob Borgwardt A, NP  fexofenadine (ALLEGRA ALLERGY) 60 MG tablet Take 1 tablet (60 mg total) by mouth daily. 05/29/22  Yes Valentino NoseMartinez, Avyonna Wagoner A, NP  albuterol (VENTOLIN HFA) 108 (90 Base) MCG/ACT inhaler Inhale 1 puff into the lungs every 6 (six) hours as needed.    [provider]  buPROPion (WELLBUTRIN XL) 150 MG 24 hr tablet Take 1 tablet (150 mg total) by mouth daily. 03/22/22   Nche, Bonna Gainsharlotte Lum, NP  hydrOXYzine (ATARAX) 25 MG tablet Take 25 mg by mouth 3 (three) times daily as needed.    [provider]  ibuprofen (ADVIL) 600 MG tablet Take 1 tablet (600 mg total) by mouth every 8 (eight) hours as needed. With food 03/22/22   Nche, Bonna Gainsharlotte Lum, NP  triamcinolone (NASACORT) 55 MCG/ACT AERO nasal inhaler USE 1 SPRAY(S) IN EACH NOSTRIL AS DIRECTED AS NEEDED 05/03/20   [provider]  valACYclovir (VALTREX) 1000 MG tablet Take 1,000 mg by mouth 2 (two) times daily.    [provider]    Family History Family History  Problem Relation  Age of Onset   Asthma Mother    Diabetes Mother    Asthma Brother    Asthma Maternal Aunt    Diabetes Maternal Aunt    Hypertension Maternal Aunt    Asthma Maternal Grandmother    Stroke Maternal Grandmother    Diabetes Maternal Grandfather    Kidney disease Maternal Grandfather    Asthma Paternal Grandmother    Alcohol abuse Other        grandparents   Arthritis Other        grandparent   Cancer Other        relative   Stroke Other        grandparent   Kidney disease Other        grandparents   Diabetes Other        grandparent    Social History Social History   Tobacco Use   Smoking status: Former    Types: Cigars   Smokeless tobacco: Never   Tobacco comments:    I went cold [place] in 2012 for 3 years. 2015-2022 tired to stop countless times.  Vaping Use   Vaping Use: Never used  Substance Use Topics   Alcohol use: Yes    Comment: 1 bottle of wine/month   Drug  use: Yes    Frequency: 7.0 times per week    Types: Marijuana     Allergies   Peanut oil, Pseudoephedrine hcl, and Sudafed [pseudoephedrine hcl]   Review of Systems Review of Systems Per HPI  Physical Exam Triage Vital Signs ED Triage Vitals  Enc Vitals Group     BP 05/29/22 1418 (!) 149/108     Pulse Rate 05/29/22 1418 89     Resp 05/29/22 1418 20     Temp 05/29/22 1418 98 F (36.7 C)     Temp Source 05/29/22 1418 Oral     SpO2 05/29/22 1418 97 %     Weight --      Height --      Head Circumference --      Peak Flow --      Pain Score 05/29/22 1421 3     Pain Loc --      Pain Edu? --      Excl. in GC? --    No data found.  Updated Vital Signs BP (!) 149/108 (BP Location: Right Arm)   Pulse 84   Temp 98 F (36.7 C) (Oral)   Resp 20   LMP 04/29/2022 (Exact Date)   SpO2 97%   Visual Acuity Right Eye Distance:   Left Eye Distance:   Bilateral Distance:    Right Eye Near:   Left Eye Near:    Bilateral Near:     Physical Exam Vitals and nursing note reviewed.  Constitutional:      General: She is not in acute distress.    Appearance: Normal appearance. She is not toxic-appearing.  HENT:     Head: Normocephalic and atraumatic.     Nose: Nose normal. No congestion.     Mouth/Throat:     Mouth: Mucous membranes are moist.     Pharynx: Oropharynx is clear.  Cardiovascular:     Rate and Rhythm: Normal rate and regular rhythm.  Pulmonary:     Effort: Pulmonary effort is normal.     Breath sounds: Decreased air movement present. No wheezing, rhonchi or rales.     Comments: Upper lung lobes are clear to auscultation bilaterally; there is decreased air movement in  the mid and lower lobes bilaterally  Musculoskeletal:     Cervical back: Normal range of motion. No rigidity.  Lymphadenopathy:     Cervical: No cervical adenopathy.  Skin:    General: Skin is warm and dry.     Capillary Refill: Capillary refill takes less than 2 seconds.     Coloration: Skin  is not jaundiced or pale.     Findings: No erythema.  Neurological:     Mental Status: She is alert and oriented to person, place, and time.  Psychiatric:        Behavior: Behavior is cooperative.     UC Treatments / Results  Labs (all labs ordered are listed, but only abnormal results are displayed) Labs Reviewed - No data to display  EKG   Radiology No results found.  Procedures Procedures (including critical care time)  Medications Ordered in UC Medications  albuterol (VENTOLIN HFA) 108 (90 Base) MCG/ACT inhaler 2 puff (2 puffs Inhalation Given 05/29/22 1447)  dexamethasone (DECADRON) injection 10 mg (10 mg Intramuscular Given 05/29/22 1450)  albuterol (PROVENTIL) (2.5 MG/3ML) 0.083% nebulizer solution 2.5 mg (2.5 mg Nebulization Given 05/29/22 1444)    Initial Impression / Assessment and Plan / UC Course  I have reviewed the triage vital signs and the nursing notes.  Pertinent labs & imaging results that were available during my care of the patient were reviewed by me and considered in my medical decision making (see chart for details).    1. Allergic reaction, initial encounter  2. Wheezing  3. Chest tightness  4. History of asthma  Treat allergic reaction with Decadron 10 mg IM given today in urgent care.  Albuterol nebulizer today in urgent care helped open lungs per patient report; air movement increased substantially on auscultation.  Continue albuterol every 4-6 hours as needed for wheezing, shortness of breath, or chest tightness.  Also start daily oral antihistamine and twice daily oral Pepcid 20 mg to help with allergic response.  Note given for work.  Seek care if symptoms persist or worsen despite treatment.  Final Clinical Impressions(s) / UC Diagnoses   Final diagnoses:  Allergic reaction, initial encounter  Wheezing  Chest tightness  History of asthma     Discharge Instructions      - We have given you a steroid shot today to help correct the  allergic reaction along with an albuterol breathing treatment -Please use the albuterol inhaler every 4-6 hours as needed for wheezing, chest tightness, shortness of breath -Please start on a daily oral antihistamine like Allegra -Please also start the Pepcid 20 mg twice daily to help with the allergic response - Seek care in the emergency room if your symptoms worsen     ED Prescriptions     Medication Sig Dispense Auth. Provider   fexofenadine (ALLEGRA ALLERGY) 60 MG tablet Take 1 tablet (60 mg total) by mouth daily. 90 tablet Cathlean Marseilles A, NP   famotidine (PEPCID) 20 MG tablet Take 1 tablet (20 mg total) by mouth 2 (two) times daily. 60 tablet Valentino Nose, NP      PDMP not reviewed this encounter.   Valentino Nose, NP 05/29/22 [phone]

## 2022-11-19 ENCOUNTER — Ambulatory Visit
Admission: EM | Admit: 2022-11-19 | Discharge: 2022-11-19 | Disposition: A | Discharge status: Home / Self Care | Payer: Self-pay | Attending: Family Medicine | Admitting: Family Medicine

## 2022-11-19 DIAGNOSIS — R300 Dysuria: Secondary | ICD-10-CM | POA: Insufficient documentation

## 2022-11-19 DIAGNOSIS — N760 Acute vaginitis: Secondary | ICD-10-CM | POA: Insufficient documentation

## 2022-11-19 DIAGNOSIS — J0100 Acute maxillary sinusitis, unspecified: Secondary | ICD-10-CM | POA: Insufficient documentation

## 2022-11-19 DIAGNOSIS — J3089 Other allergic rhinitis: Secondary | ICD-10-CM | POA: Insufficient documentation

## 2022-11-19 DIAGNOSIS — J4541 Moderate persistent asthma with (acute) exacerbation: Secondary | ICD-10-CM | POA: Insufficient documentation

## 2022-11-19 LAB — POCT URINALYSIS DIP (MANUAL ENTRY)
Blood, UA: NEGATIVE
Glucose, UA: NEGATIVE mg/dL
Ketones, POC UA: NEGATIVE mg/dL
Leukocytes, UA: NEGATIVE
Nitrite, UA: NEGATIVE
Protein Ur, POC: 100 mg/dL — AB
Spec Grav, UA: >=1.03 — AB (ref 1.010–1.025)
Urobilinogen, UA: 0.2 E.U./dL
pH, UA: 6 (ref 5.0–8.0)

## 2022-11-19 MED ORDER — METHYLPREDNISOLONE SODIUM SUCC 125 MG IJ SOLR
60.0000 mg | Freq: Once | INTRAMUSCULAR | Status: AC
  Administered 2022-11-19: 60 mg via INTRAMUSCULAR

## 2022-11-19 MED ORDER — FEXOFENADINE HCL 180 MG PO TABS: 180.0000 mg | ORAL_TABLET | Freq: Every day | ORAL | 2 refills | Status: DC

## 2022-11-19 MED ORDER — AMOXICILLIN-POT CLAVULANATE 875-125 MG PO TABS: 1.0000 | ORAL_TABLET | Freq: Two times a day (BID) | ORAL | 0 refills | Status: DC

## 2022-11-19 MED ORDER — FLUTICASONE PROPIONATE 50 MCG/ACT NA SUSP: 1.0000 | Freq: Two times a day (BID) | NASAL | 2 refills | Status: DC

## 2022-11-19 NOTE — ED Triage Notes (Signed)
Pt reports: -Right sided headache on and off, right ear pain since last night. Denies any pain at this moment.  -Nasal congestion x 2 weeks.  -Burning when urinating x 2 weeks.  -White vaginal discharge x 1 week.

## 2022-11-19 NOTE — ED Notes (Signed)
Pt given ice water x2 

## 2022-11-20 LAB — CERVICOVAGINAL ANCILLARY ONLY
Bacterial Vaginitis (gardnerella): POSITIVE — AB
Candida Glabrata: NEGATIVE
Candida Vaginitis: NEGATIVE
Chlamydia: NEGATIVE
Comment: NEGATIVE
Comment: NEGATIVE
Comment: NEGATIVE
Comment: NEGATIVE
Comment: NEGATIVE
Comment: NORMAL
Neisseria Gonorrhea: NEGATIVE
Trichomonas: NEGATIVE

## 2022-11-21 ENCOUNTER — Telehealth (HOSPITAL_COMMUNITY): Payer: Self-pay | Admitting: Emergency Medicine

## 2022-11-21 MED ORDER — METRONIDAZOLE 500 MG PO TABS: 500.0000 mg | ORAL_TABLET | Freq: Two times a day (BID) | ORAL | 0 refills | Status: DC

## 2022-11-23 NOTE — ED Provider Notes (Signed)
[facility]    CSN: [id number] Arrival date & time: 11/19/22  0857      History   Chief Complaint Chief Complaint  Patient presents with   Headache   Nasal Congestion         Dysuria         HPI Teresa Peterson is a 36 y.o. female.   Patient presenting today with multiple concerns.  She states she has had nasal congestion, cough, chest tightness, wheezing for the past 2 weeks.  Denies fever, chills, chest pain, abdominal pain, nausea vomiting or diarrhea.  She has a history of asthma, seasonal allergies and emphysema states she is not currently taking her allergy medications or inhalers.  Not trying anything over-the-counter for symptoms and no known sick contacts recently.  She is also having some dysuria x 2 weeks and white vaginal discharge for the past week.  Denies pelvic or abdominal pain, nausea, vomiting, flank pain, concern for STDs or pregnancy.  LMP was 11/08/2022.    Past Medical History:  Diagnosis Date   Allergy    Anemia 09-29-1988   Born anemic, grew out of it   Asthma    Depression    Emphysema of lung (HCC)    GERD (gastroesophageal reflux disease)    Headache(784.0)    Migraines w/Aura   Heart murmur    HSV infection    Hx of vaginal herpes   Noncompliance with medications    STD (sexually transmitted disease) 02/11/2022   Tx'd for chlamydia x3    Patient Active Problem List   Diagnosis Date Noted   Severe episode of recurrent major depressive disorder, with psychotic features (HCC) 03/22/2022   Right shoulder tendonitis 03/22/2022   Asthma 11/08/2011   Tinea versicolor 11/08/2011    History reviewed. No pertinent surgical history.  OB History     Gravida  0   Para  0   Term  0   Preterm  0   AB  0   Living  0      SAB  0   IAB  0   Ectopic  0   Multiple  0   Live Births  0            Home Medications    Prior to Admission medications   Medication Sig Start Date End Date Taking? Authorizing  Provider  amoxicillin-clavulanate (AUGMENTIN) 875-125 MG tablet Take 1 tablet by mouth every 12 (twelve) hours. 11/19/22  Yes Particia Nearing, PA-C  fexofenadine [facility] ALLERGY) 180 MG tablet Take 1 tablet (180 mg total) by mouth daily. 11/19/22  Yes Particia Nearing, PA-C  fluticasone [facility]) 50 MCG/ACT nasal spray Place 1 spray into both nostrils 2 (two) times daily. 11/19/22  Yes Particia Nearing, PA-C  albuterol (VENTOLIN HFA) 108 (90 Base) MCG/ACT inhaler Inhale 1 puff into the lungs every 6 (six) hours as needed.    [provider]  buPROPion (WELLBUTRIN XL) 150 MG 24 hr tablet Take 1 tablet (150 mg total) by mouth daily. 03/22/22   Nche, Bonna Gains, NP  famotidine (PEPCID) 20 MG tablet Take 1 tablet (20 mg total) by mouth 2 (two) times daily. 05/29/22   Valentino Nose, NP  fexofenadine [facility] ALLERGY) 60 MG tablet Take 1 tablet (60 mg total) by mouth daily. 05/29/22   Valentino Nose, NP  hydrOXYzine (ATARAX) 25 MG tablet Take 25 mg by mouth 3 (three) times daily as needed.    [provider]  ibuprofen (ADVIL) 600 MG tablet Take 1 tablet (600 mg total) by mouth every 8 (eight) hours as needed. With food 03/22/22   Nche, Bonna Gains, NP  metroNIDAZOLE (FLAGYL) 500 MG tablet Take 1 tablet (500 mg total) by mouth 2 (two) times daily. 11/21/22   LampteyBritta Mccreedy, MD  triamcinolone (NASACORT) 55 MCG/ACT AERO nasal inhaler USE 1 SPRAY(S) IN EACH NOSTRIL AS DIRECTED AS NEEDED 05/03/20   [provider]  valACYclovir (VALTREX) 1000 MG tablet Take 1,000 mg by mouth 2 (two) times daily.    [provider]    Family History Family History  Problem Relation Age of Onset   Asthma Mother    Diabetes Mother    Asthma Brother    Asthma Maternal Aunt    Diabetes Maternal Aunt    Hypertension Maternal Aunt    Asthma Maternal Grandmother    Stroke Maternal Grandmother    Diabetes Maternal Grandfather    Kidney disease Maternal  Grandfather    Asthma Paternal Grandmother    Alcohol abuse Other        grandparents   Arthritis Other        grandparent   Cancer Other        relative   Stroke Other        grandparent   Kidney disease Other        grandparents   Diabetes Other        grandparent    Social History Social History   Tobacco Use   Smoking status: Former    Types: Cigars   Smokeless tobacco: Never   Tobacco comments:    I went cold [place] in 2012 for 3 years. 2015-2022 tired to stop countless times.  Vaping Use   Vaping Use: Never used  Substance Use Topics   Alcohol use: Yes    Comment: 1 bottle of wine/month   Drug use: Yes    Frequency: 7.0 times per week    Types: Marijuana     Allergies   Peanut oil, Pseudoephedrine hcl, and Sudafed [pseudoephedrine hcl]   Review of Systems Review of Systems Per HPI  Physical Exam Triage Vital Signs ED Triage Vitals  Enc Vitals Group     BP 11/19/22 0943 (!) 153/96     Pulse Rate 11/19/22 0943 (!) 105     Resp 11/19/22 0943 18     Temp 11/19/22 0943 99.1 F (37.3 C)     Temp Source 11/19/22 0943 Oral     SpO2 11/19/22 0943 97 %     Weight --      Height --      Head Circumference --      Peak Flow --      Pain Score 11/19/22 0941 0     Pain Loc --      Pain Edu? --      Excl. in GC? --    No data found.  Updated Vital Signs BP (!) 153/96 (BP Location: Right Arm)   Pulse (!) 105   Temp 99.1 F (37.3 C) (Oral)   Resp 18   LMP 11/08/2022 (Exact Date)   SpO2 97%   Visual Acuity Right Eye Distance:   Left Eye Distance:   Bilateral Distance:    Right Eye Near:   Left Eye Near:    Bilateral Near:     Physical Exam Vitals and nursing note reviewed.  Constitutional:      Appearance: Normal appearance.  HENT:  Head: Atraumatic.     Right Ear: Tympanic membrane and external ear normal.     Left Ear: Tympanic membrane and external ear normal.     Nose: Congestion present.     Mouth/Throat:     Mouth: Mucous  membranes are moist.     Pharynx: Posterior oropharyngeal erythema present.  Eyes:     Extraocular Movements: Extraocular movements intact.     Conjunctiva/sclera: Conjunctivae normal.  Cardiovascular:     Rate and Rhythm: Normal rate and regular rhythm.     Heart sounds: Normal heart sounds.  Pulmonary:     Effort: Pulmonary effort is normal.     Breath sounds: Wheezing present.  Abdominal:     General: Bowel sounds are normal. There is no distension.     Palpations: Abdomen is soft.     Tenderness: There is no abdominal tenderness. There is no right CVA tenderness, left CVA tenderness or guarding.  Genitourinary:    Comments: GU exam deferred, self swab performed Musculoskeletal:        General: Normal range of motion.     Cervical back: Normal range of motion and neck supple.  Skin:    General: Skin is warm and dry.  Neurological:     Mental Status: She is alert and oriented to person, place, and time.  Psychiatric:        Mood and Affect: Mood normal.        Thought Content: Thought content normal.      UC Treatments / Results  Labs (all labs ordered are listed, but only abnormal results are displayed) Labs Reviewed  POCT URINALYSIS DIP (MANUAL ENTRY) - Abnormal; Notable for the following components:      Result Value   Bilirubin, UA small (*)    Spec Grav, UA >=1.030 (*)    Protein Ur, POC =100 (*)    All other components within normal limits  CERVICOVAGINAL ANCILLARY ONLY - Abnormal; Notable for the following components:   Bacterial Vaginitis (gardnerella) Positive (*)    All other components within normal limits    EKG   Radiology No results found.  Procedures Procedures (including critical care time)  Medications Ordered in UC Medications  methylPREDNISolone sodium succinate (SOLU-MEDROL) 125 mg/2 mL injection 60 mg (60 mg Intramuscular Given 11/19/22 1014)    Initial Impression / Assessment and Plan / UC Course  I have reviewed the triage vital  signs and the nursing notes.  Pertinent labs & imaging results that were available during my care of the patient were reviewed by me and considered in my medical decision making (see chart for details).     Minimally hypertensive and tachycardic today in triage, otherwise vital signs reassuring.  Urinalysis without evidence of urinary tract infection, vaginal swab pending.  Will treat based on these results.  Discussed supportive over-the-counter medications and home care additionally in the meantime.  Regarding her other symptoms, will treat for sinusitis and asthma exacerbation likely secondary to uncontrolled seasonal allergies.  Will restart her allergy regimen with Allegra and Flonase, Augmentin and IM Solu-Medrol additionally.  She states she has inhalers at the pharmacy that she can pick up.  Return for worsening symptoms.  Final Clinical Impressions(s) / UC Diagnoses   Final diagnoses:  Dysuria  Acute non-recurrent maxillary sinusitis  Moderate persistent asthma with acute exacerbation  Seasonal allergic rhinitis due to other allergic trigger  Acute vaginitis   Discharge Instructions   None    ED Prescriptions  Medication Sig Dispense Auth. Provider   amoxicillin-clavulanate (AUGMENTIN) 875-125 MG tablet Take 1 tablet by mouth every 12 (twelve) hours. 14 tablet Particia NearingLane, Adrianah Prophete Elizabeth, PA-C   fluticasone [facility](FLONASE) 50 MCG/ACT nasal spray Place 1 spray into both nostrils 2 (two) times daily. 16 g Particia NearingLane, Tika Hannis [place], [place]   fexofenadine [facility](ALLEGRA ALLERGY) 180 MG tablet Take 1 tablet (180 mg total) by mouth daily. 30 tablet Particia NearingLane, Bryttani Blew [place], [place]      PDMP not reviewed this encounter.   Particia NearingLane, Panagiota Perfetti [place], [place] 11/23/22 1517

## 2022-11-29 ENCOUNTER — Encounter (HOSPITAL_BASED_OUTPATIENT_CLINIC_OR_DEPARTMENT_OTHER): Payer: Self-pay | Admitting: Emergency Medicine

## 2022-11-29 ENCOUNTER — Emergency Department (HOSPITAL_BASED_OUTPATIENT_CLINIC_OR_DEPARTMENT_OTHER)
Admission: EM | Admit: 2022-11-29 | Discharge: 2022-11-29 | Disposition: A | Discharge status: Home / Self Care | Payer: Medicaid Other | Attending: Emergency Medicine | Admitting: Emergency Medicine

## 2022-11-29 ENCOUNTER — Emergency Department (HOSPITAL_BASED_OUTPATIENT_CLINIC_OR_DEPARTMENT_OTHER): Payer: Medicaid Other

## 2022-11-29 ENCOUNTER — Other Ambulatory Visit: Payer: Self-pay

## 2022-11-29 DIAGNOSIS — R197 Diarrhea, unspecified: Secondary | ICD-10-CM | POA: Diagnosis not present

## 2022-11-29 DIAGNOSIS — R0981 Nasal congestion: Secondary | ICD-10-CM | POA: Insufficient documentation

## 2022-11-29 DIAGNOSIS — R112 Nausea with vomiting, unspecified: Secondary | ICD-10-CM | POA: Diagnosis not present

## 2022-11-29 DIAGNOSIS — R1084 Generalized abdominal pain: Secondary | ICD-10-CM | POA: Diagnosis not present

## 2022-11-29 DIAGNOSIS — J3489 Other specified disorders of nose and nasal sinuses: Secondary | ICD-10-CM | POA: Diagnosis not present

## 2022-11-29 DIAGNOSIS — N9489 Other specified conditions associated with female genital organs and menstrual cycle: Secondary | ICD-10-CM | POA: Insufficient documentation

## 2022-11-29 DIAGNOSIS — R945 Abnormal results of liver function studies: Secondary | ICD-10-CM | POA: Diagnosis not present

## 2022-11-29 DIAGNOSIS — R1110 Vomiting, unspecified: Secondary | ICD-10-CM | POA: Diagnosis present

## 2022-11-29 DIAGNOSIS — Z20822 Contact with and (suspected) exposure to covid-19: Secondary | ICD-10-CM | POA: Insufficient documentation

## 2022-11-29 DIAGNOSIS — R059 Cough, unspecified: Secondary | ICD-10-CM | POA: Insufficient documentation

## 2022-11-29 LAB — CBC WITH DIFFERENTIAL/PLATELET
Abs Immature Granulocytes: 0.05 10*3/uL (ref 0.00–0.07)
Basophils Absolute: 0 10*3/uL (ref 0.0–0.1)
Basophils Relative: 1 %
Eosinophils Absolute: 0.3 10*3/uL (ref 0.0–0.5)
Eosinophils Relative: 6 %
HCT: 40.1 % (ref 36.0–46.0)
Hemoglobin: 12.7 g/dL (ref 12.0–15.0)
Immature Granulocytes: 1 %
Lymphocytes Relative: 20 %
Lymphs Abs: 1.2 10*3/uL (ref 0.7–4.0)
MCH: 28.2 pg (ref 26.0–34.0)
MCHC: 31.7 g/dL (ref 30.0–36.0)
MCV: 88.9 fL (ref 80.0–100.0)
Monocytes Absolute: 0.7 10*3/uL (ref 0.1–1.0)
Monocytes Relative: 11 %
Neutro Abs: 3.7 10*3/uL (ref 1.7–7.7)
Neutrophils Relative %: 61 %
Platelets: 368 10*3/uL (ref 150–400)
RBC: 4.51 MIL/uL (ref 3.87–5.11)
RDW: 14.1 % (ref 11.5–15.5)
WBC: 6 10*3/uL (ref 4.0–10.5)
nRBC: 0 % (ref 0.0–0.2)

## 2022-11-29 LAB — URINALYSIS, ROUTINE W REFLEX MICROSCOPIC
Bilirubin Urine: NEGATIVE
Glucose, UA: NEGATIVE mg/dL
Hgb urine dipstick: NEGATIVE
Leukocytes,Ua: NEGATIVE
Nitrite: NEGATIVE
Specific Gravity, Urine: 1.018 (ref 1.005–1.030)
pH: 6.5 (ref 5.0–8.0)

## 2022-11-29 LAB — COMPREHENSIVE METABOLIC PANEL
ALT: 72 U/L — ABNORMAL HIGH (ref 0–44)
AST: 83 U/L — ABNORMAL HIGH (ref 15–41)
Albumin: 4.1 g/dL (ref 3.5–5.0)
Alkaline Phosphatase: 40 U/L (ref 38–126)
Anion gap: 9 (ref 5–15)
BUN: 10 mg/dL (ref 6–20)
CO2: 25 mmol/L (ref 22–32)
Calcium: 8.7 mg/dL — ABNORMAL LOW (ref 8.9–10.3)
Chloride: 103 mmol/L (ref 98–111)
Creatinine, Ser: 0.85 mg/dL (ref 0.44–1.00)
GFR, Estimated: >60 mL/min (ref >60)
Glucose, Bld: 99 mg/dL (ref 70–99)
Potassium: 3.6 mmol/L (ref 3.5–5.1)
Sodium: 137 mmol/L (ref 135–145)
Total Bilirubin: 0.4 mg/dL (ref 0.3–1.2)
Total Protein: 7.3 g/dL (ref 6.5–8.1)

## 2022-11-29 LAB — RESP PANEL BY RT-PCR (RSV, FLU A&B, COVID)  RVPGX2
Influenza A by PCR: NEGATIVE
Influenza B by PCR: NEGATIVE
Resp Syncytial Virus by PCR: NEGATIVE
SARS Coronavirus 2 by RT PCR: NEGATIVE

## 2022-11-29 LAB — HCG, SERUM, QUALITATIVE: Preg, Serum: NEGATIVE

## 2022-11-29 LAB — LIPASE, BLOOD: Lipase: 12 U/L (ref 11–51)

## 2022-11-29 IMAGING — US US ABDOMEN LIMITED
1 series · 14 of 25 positions shown · non-contrast
Comparison: None.

CLINICAL DATA: Abdominal pain.

EXAM:
ULTRASOUND ABDOMEN LIMITED RIGHT UPPER QUADRANT

[Series 1: us abdomen limited ruq (liver/gb) · 14 of 58 slices shown]
[im 1/58]
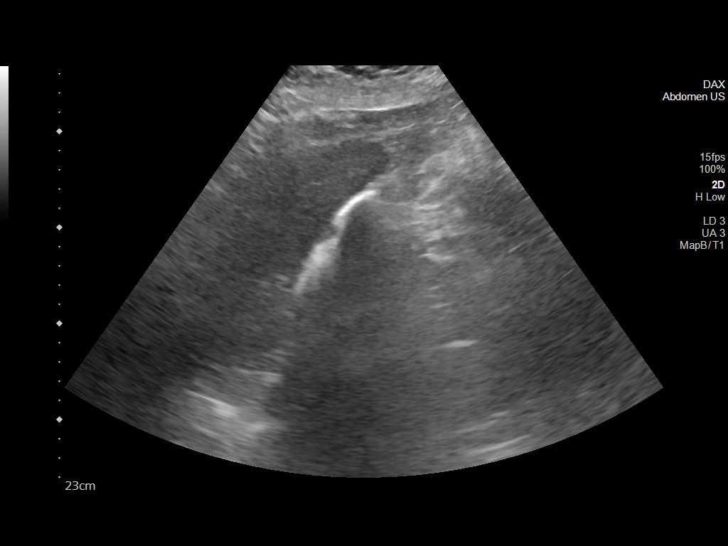
[im 5/58]
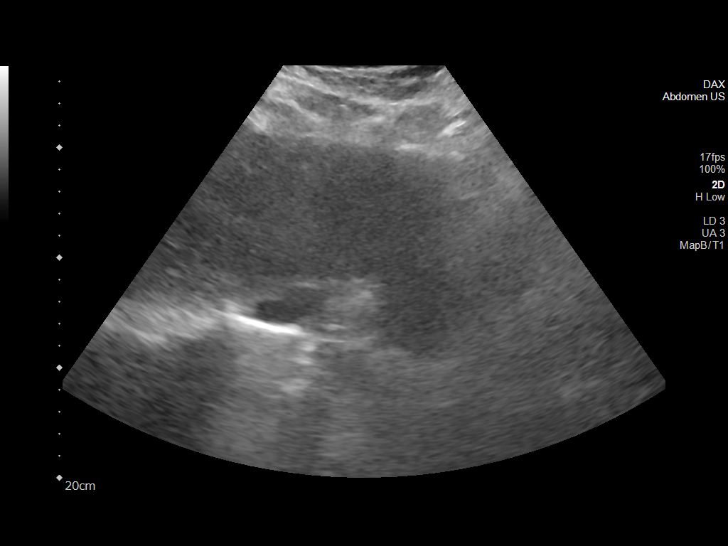
[im 10/58]
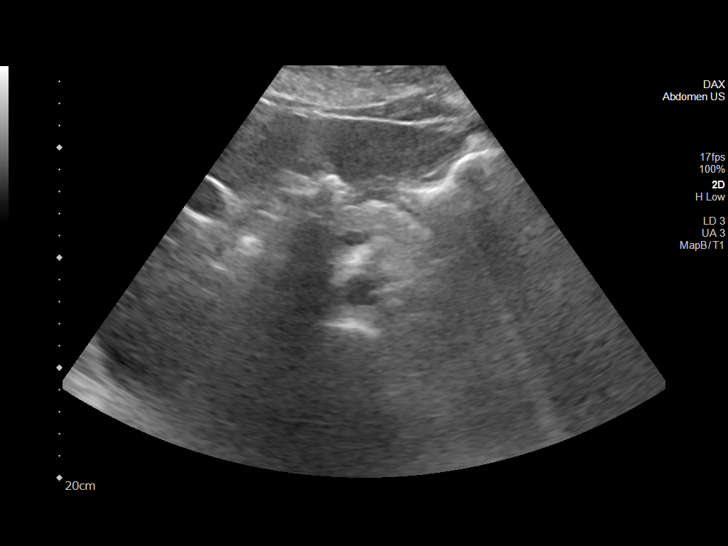
[im 15/58]
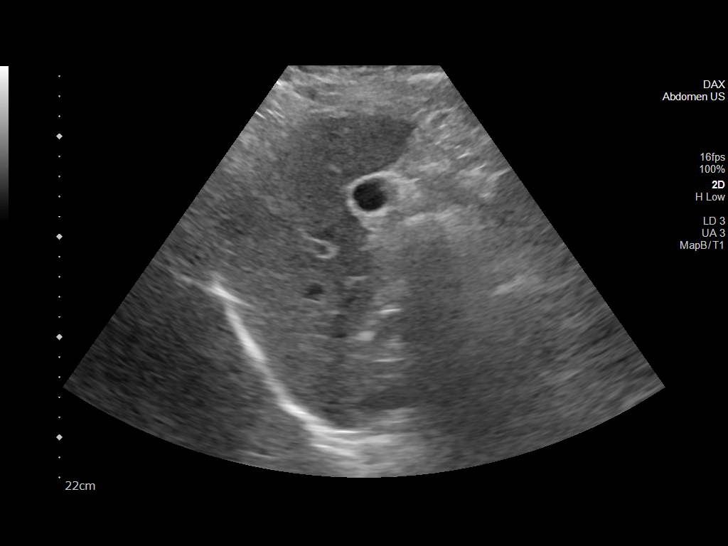
[im 20/58]
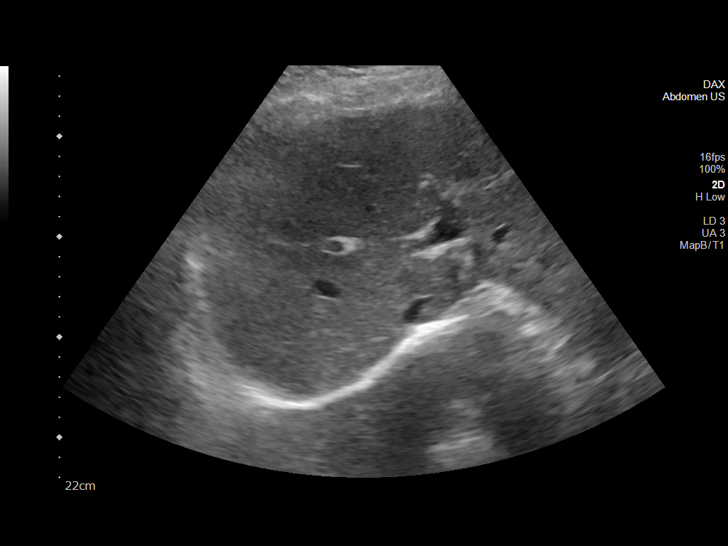
[im 22/58]
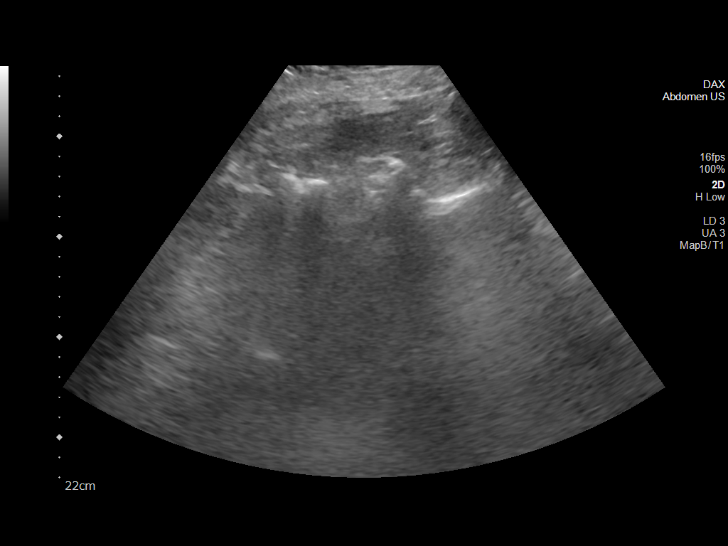
[im 27/58]
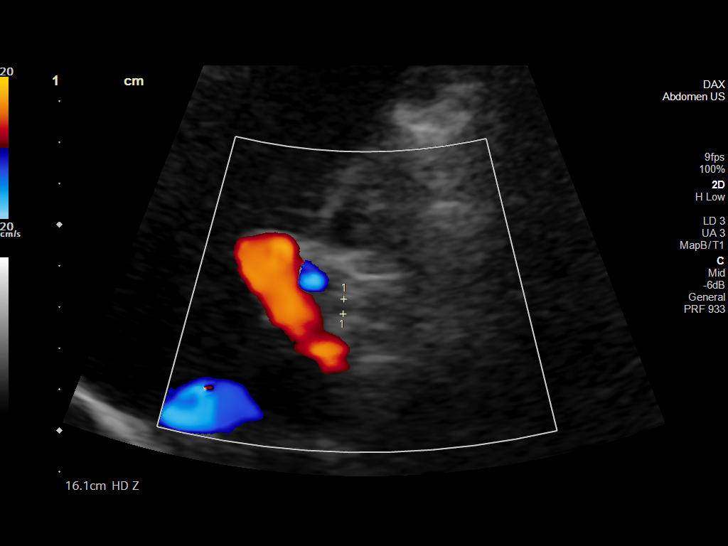
[im 31/58]
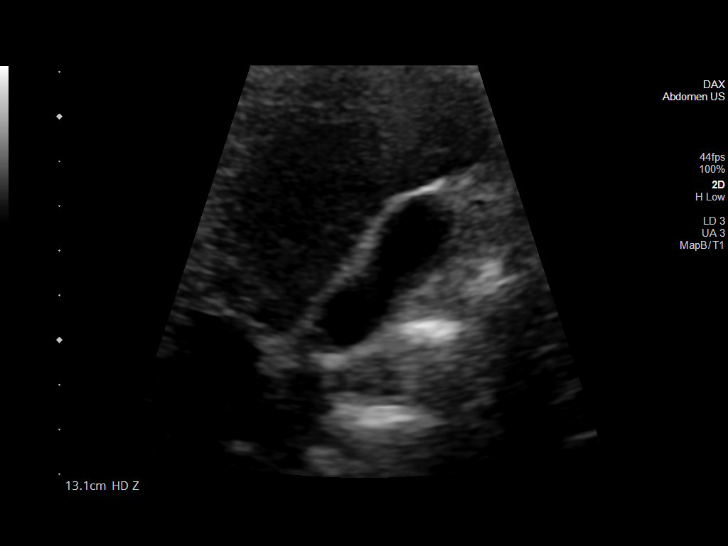
[im 36/58]
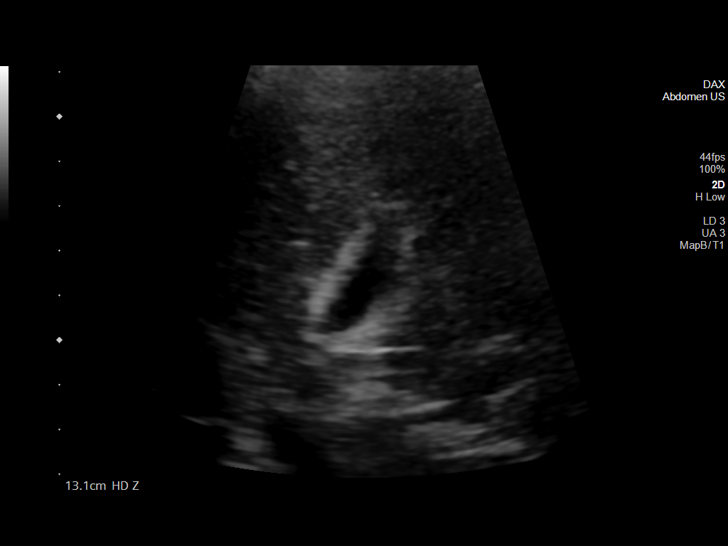
[im 39/58]
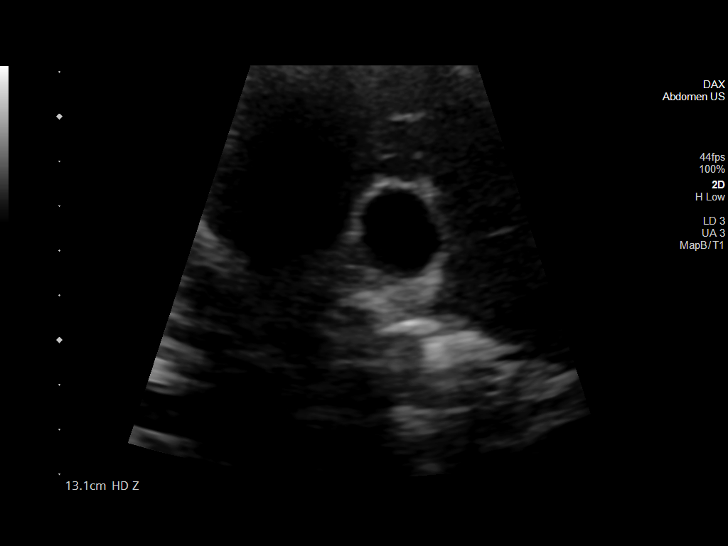
[im 43/58]
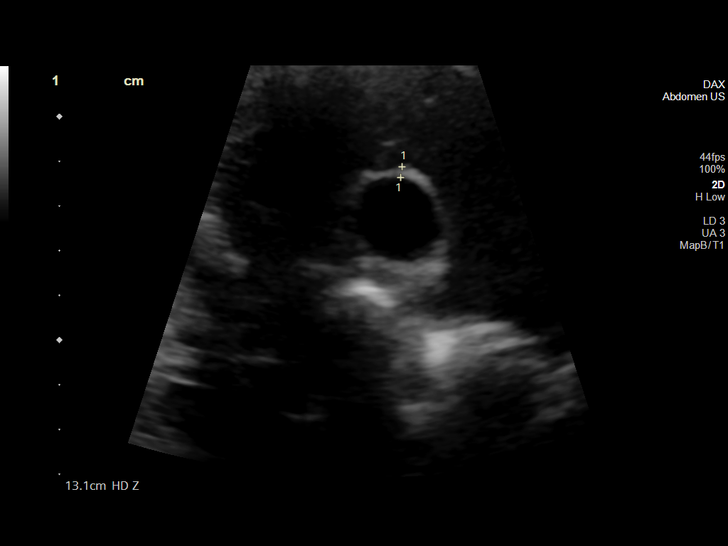
[im 48/58]
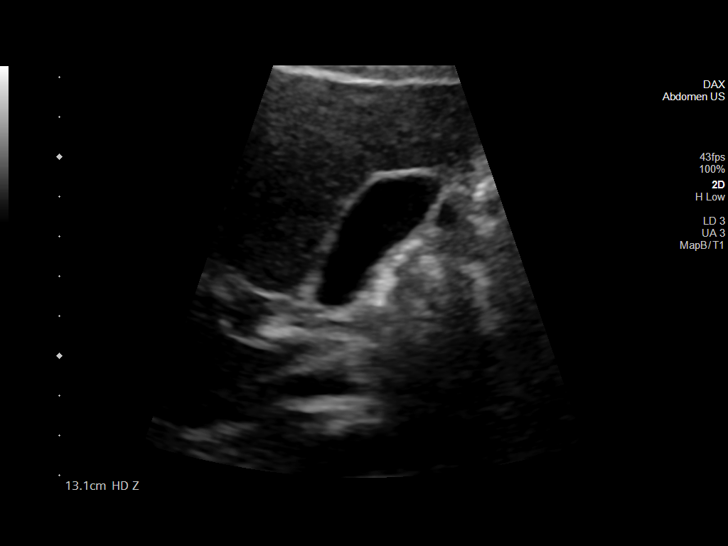
[im 53/58]
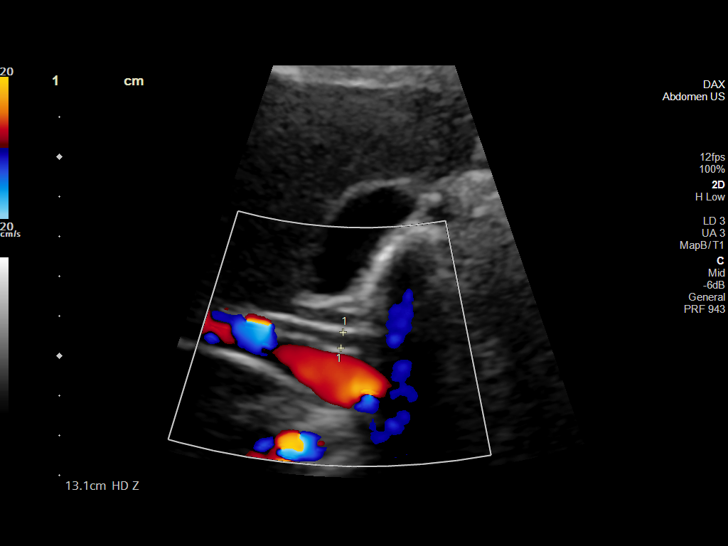
[im 58/58]
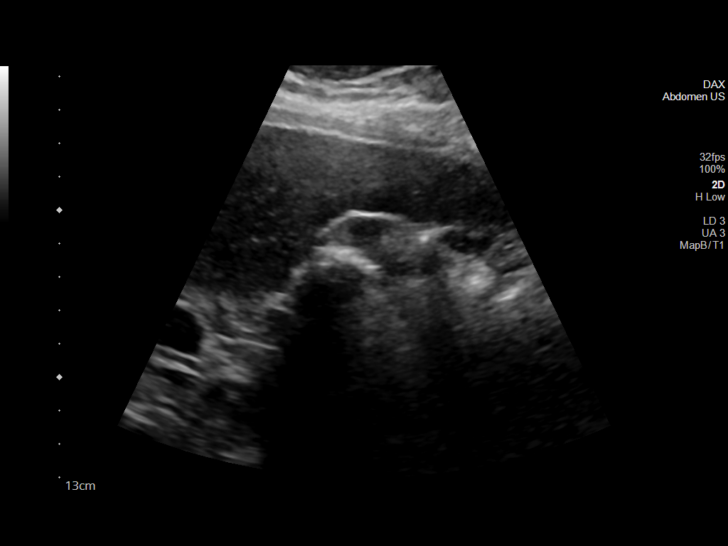

[14 of 25 positions shown; findings below may reference images not displayed]

FINDINGS: Gallbladder:

No gallstones or wall thickening visualized. No sonographic Murphy
sign noted by sonographer.

Common bile duct:

Diameter: 4 mm

Liver:

No focal lesion identified. Within normal limits in parenchymal
echogenicity. Portal vein is patent on color Doppler imaging with
normal direction of blood flow towards the liver.

Other: None.
IMPRESSION: Unremarkable right upper quadrant ultrasound.

## 2022-11-29 MED ORDER — ONDANSETRON HCL 4 MG/2ML IJ SOLN
4.0000 mg | Freq: Once | INTRAMUSCULAR | Status: AC
  Administered 2022-11-29: 4 mg via INTRAVENOUS
  Filled 2022-11-29: qty 2

## 2022-11-29 MED ORDER — ONDANSETRON 4 MG PO TBDP: 4.0000 mg | ORAL_TABLET | Freq: Three times a day (TID) | ORAL | 0 refills | Status: DC | PRN

## 2022-11-29 MED ORDER — DICYCLOMINE HCL 20 MG PO TABS: 20.0000 mg | ORAL_TABLET | Freq: Two times a day (BID) | ORAL | 0 refills | Status: DC

## 2022-11-29 MED ORDER — SODIUM CHLORIDE 0.9 % IV BOLUS
1000.0000 mL | Freq: Once | INTRAVENOUS | Status: AC
  Administered 2022-11-29: 1000 mL via INTRAVENOUS

## 2022-11-29 NOTE — ED Notes (Signed)
Pt states she has been drinking water, denying nausea and pain at present.

## 2022-11-29 NOTE — Discharge Instructions (Signed)
It was a pleasure taking care of you here in the emergency department  As discussed in the room your liver function tests are minimally elevated.  Please make sure to follow-up with primary care doctor in the next 2 weeks for repeat testing  Your workup here is reassuring.  I discharged on a few medications.  Take as prescribed.  Return for new or worsening symptoms

## 2022-11-29 NOTE — ED Notes (Signed)
Patient unable to provide urine sample at this time

## 2022-11-29 NOTE — ED Notes (Signed)
Discharge instructions, follow up care, and prescriptions reviewed and explained, pt verbalized understanding. Pt caox4 and ambulatory on d/c denying N/V and pain.

## 2022-11-29 NOTE — ED Triage Notes (Signed)
Vomiting since last night, diarrhea and chills, body aches

## 2022-11-29 NOTE — ED Provider Notes (Signed)
[facility] EMERGENCY DEPT Provider Note   CSN: [id number] Arrival date & time: 11/29/22  1741    History  Chief Complaint  Patient presents with   Emesis    Teresa Peterson is a 36 y.o. female with hx of medical problems here for evaluation of emesis.  Began yesterday.  NBNB.  She also noted loose stools which began Wednesday, 2 days PTA without melena or per rectum.  She has diffuse myalgias and diffuse abdominal pain.  Some associated congestion, rhinorrhea and cough.  Possible sick contacts at work.  No fever.  No meds PTA.  No dysuria or hematuria. No Etoh use. Some MJ use.  HPI     Home Medications Prior to Admission medications   Medication Sig Start Date End Date Taking? Authorizing Provider  dicyclomine (BENTYL) 20 MG tablet Take 1 tablet (20 mg total) by mouth 2 (two) times daily. 11/29/22  Yes Braeson Rupe A, PA-C  ondansetron (ZOFRAN-ODT) 4 MG disintegrating tablet Take 1 tablet (4 mg total) by mouth every 8 (eight) hours as needed for nausea or vomiting. 11/29/22  Yes Kaleem Sartwell A, PA-C  albuterol (VENTOLIN HFA) 108 (90 Base) MCG/ACT inhaler Inhale 1 puff into the lungs every 6 (six) hours as needed.    [provider]  amoxicillin-clavulanate (AUGMENTIN) 875-125 MG tablet Take 1 tablet by mouth every 12 (twelve) hours. 11/19/22   Volney American, PA-C  buPROPion (WELLBUTRIN XL) 150 MG 24 hr tablet Take 1 tablet (150 mg total) by mouth daily. 03/22/22   Nche, Charlene Brooke, NP  famotidine (PEPCID) 20 MG tablet Take 1 tablet (20 mg total) by mouth 2 (two) times daily. 05/29/22   Eulogio Bear, NP  fexofenadine [facility] ALLERGY) 180 MG tablet Take 1 tablet (180 mg total) by mouth daily. 11/19/22   Volney American, PA-C  fexofenadine [facility] ALLERGY) 60 MG tablet Take 1 tablet (60 mg total) by mouth daily. 05/29/22   Eulogio Bear, NP  fluticasone (FLONASE) 50 MCG/ACT nasal spray Place 1 spray into both nostrils 2  (two) times daily. 11/19/22   Volney American, PA-C  hydrOXYzine (ATARAX) 25 MG tablet Take 25 mg by mouth 3 (three) times daily as needed.    [provider]  ibuprofen (ADVIL) 600 MG tablet Take 1 tablet (600 mg total) by mouth every 8 (eight) hours as needed. With food 03/22/22   Nche, Charlene Brooke, NP  metroNIDAZOLE (FLAGYL) 500 MG tablet Take 1 tablet (500 mg total) by mouth 2 (two) times daily. 11/21/22   LampteyMyrene Galas, MD  triamcinolone (NASACORT) 55 MCG/ACT AERO nasal inhaler USE 1 SPRAY(S) IN EACH NOSTRIL AS DIRECTED AS NEEDED 05/03/20   [provider]  valACYclovir (VALTREX) 1000 MG tablet Take 1,000 mg by mouth 2 (two) times daily.    [provider]      Allergies    Peanut oil, Pseudoephedrine hcl, and Sudafed [pseudoephedrine hcl]    Review of Systems   Review of Systems  Constitutional: Negative.   HENT:  Positive for congestion, postnasal drip and rhinorrhea. Negative for sore throat, tinnitus, trouble swallowing and voice change.   Respiratory:  Positive for cough. Negative for apnea, choking, chest tightness, shortness of breath, wheezing and stridor.   Cardiovascular: Negative.   Gastrointestinal:  Positive for abdominal pain, diarrhea, nausea and vomiting. Negative for abdominal distention, anal bleeding, blood in stool, constipation and rectal pain.  Genitourinary: Negative.   Musculoskeletal:  Positive for myalgias. Negative for arthralgias, back  pain, gait problem, joint swelling, neck pain and neck stiffness.  Skin: Negative.   Neurological: Negative.   All other systems reviewed and are negative.   Physical Exam Updated Vital Signs BP (!) 142/85 (BP Location: Left Arm)   Pulse 78   Temp 98.1 F (36.7 C) (Oral)   Resp 18   LMP 11/08/2022 (Exact Date)   SpO2 99%  Physical Exam Vitals and nursing note reviewed.  Constitutional:      General: She is not in acute distress.    Appearance: She is well-developed. She is not  ill-appearing, toxic-appearing or diaphoretic.  HENT:     Head: Atraumatic.     Nose: Congestion and rhinorrhea present.     Mouth/Throat:     Mouth: Mucous membranes are moist.  Eyes:     Pupils: Pupils are equal, round, and reactive to light.  Cardiovascular:     Rate and Rhythm: Normal rate.     Pulses: Normal pulses.     Heart sounds: Normal heart sounds.  Pulmonary:     Effort: Pulmonary effort is normal. No respiratory distress.     Breath sounds: Normal breath sounds.  Abdominal:     General: Bowel sounds are normal. There is no distension.     Palpations: Abdomen is soft.     Tenderness: There is generalized abdominal tenderness. There is no right CVA tenderness, left CVA tenderness, guarding or rebound. Negative signs include Murphy's sign and McBurney's sign.  Musculoskeletal:        General: Normal range of motion.     Cervical back: Normal range of motion.  Skin:    General: Skin is warm and dry.     Capillary Refill: Capillary refill takes less than 2 seconds.  Neurological:     General: No focal deficit present.     Mental Status: She is alert.  Psychiatric:        Mood and Affect: Mood normal.    ED Results / Procedures / Treatments   Labs (all labs ordered are listed, but only abnormal results are displayed) Labs Reviewed  COMPREHENSIVE METABOLIC PANEL - Abnormal; Notable for the following components:      Result Value   Calcium 8.7 (*)    AST 83 (*)    ALT 72 (*)    All other components within normal limits  URINALYSIS, ROUTINE W REFLEX MICROSCOPIC - Abnormal; Notable for the following components:   Ketones, ur TRACE (*)    Protein, ur TRACE (*)    All other components within normal limits  RESP PANEL BY RT-PCR (RSV, FLU A&B, COVID)  RVPGX2  CBC WITH DIFFERENTIAL/PLATELET  LIPASE, BLOOD  HCG, SERUM, QUALITATIVE    EKG None  Radiology US Abdomen Limited RUQ (LIVER/GB)  Result Date: 11/29/2022 CLINICAL DATA:  Elevated LFTs.  Nausea and  vomiting. EXAM: ULTRASOUND ABDOMEN LIMITED RIGHT UPPER QUADRANT COMPARISON:  Ultrasound 02/07/2022 FINDINGS: Gallbladder: Physiologically distended. No gallstones or wall thickening visualized. No sonographic Murphy sign noted by sonographer. Common bile duct: Diameter: 2 mm, normal. Liver: No focal lesion identified. Within normal limits in parenchymal echogenicity. No capsular nodularity. Portal vein is patent on color Doppler imaging with normal direction of blood flow towards the liver. Other: No right upper quadrant ascites. IMPRESSION: Unremarkable right upper quadrant ultrasound. No explanation for symptoms. Electronically Signed   By: Keith Rake M.D.   On: 11/29/2022 20:21    Procedures Procedures    Medications Ordered in ED Medications  ondansetron (ZOFRAN) injection 4  mg (4 mg Intravenous Given 11/29/22 1803)  sodium chloride 0.9 % bolus 1,000 mL (0 mLs Intravenous Stopped 11/29/22 2030)    ED Course/ Medical Decision Making/ A&P    36 year old here for evaluation of nausea, vomiting, diarrhea as well as setting of URI symptoms.  Has some diffuse tenderness to her abdomen on exam however no rebound or guarding.  Multiple episodes of NBNB emesis.  No melena.  She appears mildly dehydrated.  Will plan on labs, imaging.  Labs and imaging personally viewed and interpreted:  UA negative for infection, trace ketones suspect likely due to dehydration Lipase 12 Pregnancy test negative COVID, flu negative Metabolic panel mild elevation LFTs, AST 83, ALT 72, normal alk phos, T. bili CBC without leukocytosis  Patient reassessed.  Feels significantly improved after IV fluids and Zofran.  Discussed elevation in LFTs.  Will get ultrasound  Ultrasound right upper quadrant without any cholecystitis, cholelithiasis, choledocholithiasis  Patient reassessed.  I discussed her labs and imaging.  She is tolerating p.o. intake.  Suspect likely viral illness as cause of her symptoms.      Fluid bolus given.  Labs, imaging and vitals reviewed.  Patient does not meet the SIRS or Sepsis criteria.  On repeat exam patient does not have a surgical abdomin and there are no peritoneal signs.  No indication of appendicitis, bowel obstruction, bowel perforation, cholecystitis, diverticulitis, PID, torsion, ectopic pregnancy, AAA, dissection.   The patient has been appropriately medically screened and/or stabilized in the ED. I have low suspicion for any other emergent medical condition which would require further screening, evaluation or treatment in the ED or require inpatient management.  Patient is hemodynamically stable and in no acute distress.  Patient able to ambulate in department prior to ED.  Evaluation does not show acute pathology that would require ongoing or additional emergent interventions while in the emergency department or further inpatient treatment.  I have discussed the diagnosis with the patient and answered all questions.  Pain is been managed while in the emergency department and patient has no further complaints prior to discharge.  Patient is comfortable with plan discussed in room and is stable for discharge at this time.  I have discussed strict return precautions for returning to the emergency department.  Patient was encouraged to follow-up with PCP/specialist refer to at discharge.    Will have her keep close monitor outpatient with her PCP or return for new worsening symptoms                           Medical Decision Making Amount and/or Complexity of Data Reviewed External Data Reviewed: labs, radiology, ECG and notes. Labs: ordered. Decision-making details documented in ED Course. Radiology: ordered and independent interpretation performed. Decision-making details documented in ED Course.  Risk OTC drugs. Prescription drug management. Parenteral controlled substances. Decision regarding hospitalization. Diagnosis or treatment significantly limited  by social determinants of health.          Final Clinical Impression(s) / ED Diagnoses Final diagnoses:  Nausea vomiting and diarrhea    Rx / DC Orders ED Discharge Orders          Ordered    dicyclomine (BENTYL) 20 MG tablet  2 times daily        11/29/22 2058    ondansetron (ZOFRAN-ODT) 4 MG disintegrating tablet  Every 8 hours PRN        11/29/22 2058  Kenlea Woodell A, PA-C 11/29/22 2103    Jeanell Sparrow, DO 11/30/22 2028

## 2023-05-13 ENCOUNTER — Emergency Department (HOSPITAL_BASED_OUTPATIENT_CLINIC_OR_DEPARTMENT_OTHER): Payer: 59 | Admitting: Radiology

## 2023-05-13 ENCOUNTER — Telehealth: Payer: Self-pay | Admitting: Nurse Practitioner

## 2023-05-13 ENCOUNTER — Other Ambulatory Visit: Payer: Self-pay

## 2023-05-13 ENCOUNTER — Encounter (HOSPITAL_BASED_OUTPATIENT_CLINIC_OR_DEPARTMENT_OTHER): Payer: Self-pay

## 2023-05-13 ENCOUNTER — Emergency Department (HOSPITAL_BASED_OUTPATIENT_CLINIC_OR_DEPARTMENT_OTHER)
Admission: EM | Admit: 2023-05-13 | Discharge: 2023-05-13 | Disposition: A | Discharge status: Home / Self Care | Payer: 59 | Attending: Emergency Medicine | Admitting: Emergency Medicine

## 2023-05-13 DIAGNOSIS — I10 Essential (primary) hypertension: Secondary | ICD-10-CM | POA: Diagnosis not present

## 2023-05-13 DIAGNOSIS — J45909 Unspecified asthma, uncomplicated: Secondary | ICD-10-CM | POA: Diagnosis not present

## 2023-05-13 DIAGNOSIS — R0789 Other chest pain: Secondary | ICD-10-CM | POA: Insufficient documentation

## 2023-05-13 DIAGNOSIS — K921 Melena: Secondary | ICD-10-CM | POA: Diagnosis not present

## 2023-05-13 LAB — COMPREHENSIVE METABOLIC PANEL
ALT: 12 U/L (ref 0–44)
AST: 15 U/L (ref 15–41)
Albumin: 4.2 g/dL (ref 3.5–5.0)
Alkaline Phosphatase: 45 U/L (ref 38–126)
Anion gap: 7 (ref 5–15)
BUN: 9 mg/dL (ref 6–20)
CO2: 25 mmol/L (ref 22–32)
Calcium: 8.9 mg/dL (ref 8.9–10.3)
Chloride: 106 mmol/L (ref 98–111)
Creatinine, Ser: 0.75 mg/dL (ref 0.44–1.00)
GFR, Estimated: >60 mL/min (ref >60)
Glucose, Bld: 112 mg/dL — ABNORMAL HIGH (ref 70–99)
Potassium: 4.3 mmol/L (ref 3.5–5.1)
Sodium: 138 mmol/L (ref 135–145)
Total Bilirubin: 0.5 mg/dL (ref 0.3–1.2)
Total Protein: 6.9 g/dL (ref 6.5–8.1)

## 2023-05-13 LAB — CBC
HCT: 41.6 % (ref 36.0–46.0)
Hemoglobin: 13.3 g/dL (ref 12.0–15.0)
MCH: 28.7 pg (ref 26.0–34.0)
MCHC: 32 g/dL (ref 30.0–36.0)
MCV: 89.8 fL (ref 80.0–100.0)
Platelets: 331 10*3/uL (ref 150–400)
RBC: 4.63 MIL/uL (ref 3.87–5.11)
RDW: 14.1 % (ref 11.5–15.5)
WBC: 8.8 10*3/uL (ref 4.0–10.5)
nRBC: 0 % (ref 0.0–0.2)

## 2023-05-13 LAB — TROPONIN I (HIGH SENSITIVITY): Troponin I (High Sensitivity): 4 ng/L (ref <18)

## 2023-05-13 LAB — HCG, SERUM, QUALITATIVE: Preg, Serum: NEGATIVE

## 2023-05-13 NOTE — ED Provider Notes (Signed)
[place] EMERGENCY DEPARTMENT AT [facility] Provider Note   CSN: [id number] Arrival date & time: 05/13/23  1247     History  Chief Complaint  Patient presents with   Chest Pain    Teresa Peterson is a 37 y.o. female.  Patient here with some chest discomfort.  Blood intermixed in her stool.  Not having any active symptoms.  She noticed blood in her stool once or twice last night and this morning.  Now no longer noticing it.  She has had some constipation.  She not on any blood thinners.  No abdominal pain.  History of asthma acid reflux.  She denies any exertional pain.  No recent surgery or travel.  She is on birth control.  Denies any headache, weakness, numbness, tingling.  No shortness of breath.  The history is provided by the patient.       Home Medications Prior to Admission medications   Medication Sig Start Date End Date Taking? Authorizing Provider  albuterol (VENTOLIN HFA) 108 (90 Base) MCG/ACT inhaler Inhale 1 puff into the lungs every 6 (six) hours as needed.    [provider]  amoxicillin-clavulanate (AUGMENTIN) 875-125 MG tablet Take 1 tablet by mouth every 12 (twelve) hours. 11/19/22   Particia Nearing, PA-C  buPROPion (WELLBUTRIN XL) 150 MG 24 hr tablet Take 1 tablet (150 mg total) by mouth daily. 03/22/22   Nche, Bonna Gains, NP  dicyclomine (BENTYL) 20 MG tablet Take 1 tablet (20 mg total) by mouth 2 (two) times daily. 11/29/22   Henderly, Britni A, PA-C  famotidine (PEPCID) 20 MG tablet Take 1 tablet (20 mg total) by mouth 2 (two) times daily. 05/29/22   Valentino Nose, NP  fexofenadine [facility] ALLERGY) 180 MG tablet Take 1 tablet (180 mg total) by mouth daily. 11/19/22   Particia Nearing, PA-C  fexofenadine [facility] ALLERGY) 60 MG tablet Take 1 tablet (60 mg total) by mouth daily. 05/29/22   Valentino Nose, NP  fluticasone (FLONASE) 50 MCG/ACT nasal spray Place 1 spray into both nostrils 2 (two) times daily. 11/19/22    Particia Nearing, PA-C  hydrOXYzine (ATARAX) 25 MG tablet Take 25 mg by mouth 3 (three) times daily as needed.    [provider]  ibuprofen (ADVIL) 600 MG tablet Take 1 tablet (600 mg total) by mouth every 8 (eight) hours as needed. With food 03/22/22   Nche, Bonna Gains, NP  metroNIDAZOLE (FLAGYL) 500 MG tablet Take 1 tablet (500 mg total) by mouth 2 (two) times daily. 11/21/22   Lamptey, Britta Mccreedy, MD  ondansetron (ZOFRAN-ODT) 4 MG disintegrating tablet Take 1 tablet (4 mg total) by mouth every 8 (eight) hours as needed for nausea or vomiting. 11/29/22   Henderly, Britni A, PA-C  triamcinolone (NASACORT) 55 MCG/ACT AERO nasal inhaler USE 1 SPRAY(S) IN EACH NOSTRIL AS DIRECTED AS NEEDED 05/03/20   [provider]  valACYclovir (VALTREX) 1000 MG tablet Take 1,000 mg by mouth 2 (two) times daily.    [provider]      Allergies    Peanut oil, Pseudoephedrine hcl, and Sudafed [pseudoephedrine hcl]    Review of Systems   Review of Systems  Physical Exam Updated Vital Signs BP (!) 174/122 (BP Location: Right Arm)   Pulse 80   Temp 98.2 F (36.8 C) (Oral)   Resp 18   Ht 5\' 3"  (1.6 m)   Wt 90.7 kg   SpO2 99%   BMI 35.43 kg/m  Physical  Exam Vitals and nursing note reviewed.  Constitutional:      General: She is not in acute distress.    Appearance: She is well-developed. She is not ill-appearing.  HENT:     Head: Normocephalic and atraumatic.  Eyes:     Extraocular Movements: Extraocular movements intact.     Conjunctiva/sclera: Conjunctivae normal.     Pupils: Pupils are equal, round, and reactive to light.  Cardiovascular:     Rate and Rhythm: Normal rate and regular rhythm.     Pulses:          Radial pulses are 2+ on the right side and 2+ on the left side.     Heart sounds: Normal heart sounds. No murmur heard. Pulmonary:     Effort: Pulmonary effort is normal. No respiratory distress.     Breath sounds: Normal breath sounds.  Abdominal:      Palpations: Abdomen is soft.     Tenderness: There is no abdominal tenderness.  Musculoskeletal:        General: No swelling.     Cervical back: Normal range of motion and neck supple.  Skin:    General: Skin is warm and dry.     Capillary Refill: Capillary refill takes less than 2 seconds.  Neurological:     Mental Status: She is alert.  Psychiatric:        Mood and Affect: Mood normal.     ED Results / Procedures / Treatments   Labs (all labs ordered are listed, but only abnormal results are displayed) Labs Reviewed  COMPREHENSIVE METABOLIC PANEL - Abnormal; Notable for the following components:      Result Value   Glucose, Bld 112 (*)    All other components within normal limits  HCG, SERUM, QUALITATIVE  CBC  TROPONIN I (HIGH SENSITIVITY)    EKG EKG Interpretation  Date/Time:  Tuesday May 13 2023 13:06:36 EDT Ventricular Rate:  80 PR Interval:  130 QRS Duration: 76 QT Interval:  372 QTC Calculation: 429 R Axis:   72 Text Interpretation: Normal sinus rhythm Normal ECG When compared with ECG of 14-Feb-2021 21:45, No significant change was found Confirmed by Virgina Norfolk ([id number]) on 05/13/2023 2:48:43 PM  Radiology DG Chest 2 View  Result Date: 05/13/2023 CLINICAL DATA:  Chest pain. EXAM: CHEST - 2 VIEW COMPARISON:  02/14/2021. FINDINGS: Clear lungs. Normal heart size and mediastinal contours. No pleural effusion or pneumothorax. Visualized bones and upper abdomen are unremarkable. IMPRESSION: No evidence of acute cardiopulmonary disease. Electronically Signed   By: Orvan Falconer M.D.   On: 05/13/2023 14:07    Procedures Procedures    Medications Ordered in ED Medications - No data to display  ED Course/ Medical Decision Making/ A&P                             Medical Decision Making  Teresa Peterson is here with chest pain, blood in her stool.  History of asthma, acid reflux.  Overall unremarkable vitals except for mild hypertension.  Overall as  far as blood in the stool.  Hemoglobin is normal.  She describes what sounds like may be more constipation related blood in stool.  She is not passing blood clots.  She not having any melena.  Overall recommend MiraLAX.  Her main concern is some chest discomfort she has had over the last 2 days.  Nonexertional.  Atypical sounding chest pain.  EKG, basic labs and  troponin ordered.  Per my review and interpretation there is no pneumonia or pneumothorax on chest x-ray.  Troponin is normal.  No significant anemia or electrolyte abnormality or kidney injury otherwise.  Patient is PERC negative.  Heart score 1.  Troponin normal.  EKG with sinus rhythm.  No ischemic changes.  Overall I suspect that this is MSK related or reflux related.  Will have her follow-up with primary care doctor.  Discharged in good condition.  No concern for PE or other acute pulmonary or cardiac process.  She understands return precautions.  This chart was dictated using voice recognition software.  Despite best efforts to proofread,  errors can occur which can change the documentation meaning.          Final Clinical Impression(s) / ED Diagnoses Final diagnoses:  Atypical chest pain  Blood in stool    Rx / DC Orders ED Discharge Orders     None         Virgina Norfolk, DO 05/13/23 1513

## 2023-05-13 NOTE — Discharge Instructions (Signed)
Recommend buying MiraLAX over-the-counter using 1 scoop daily and titrate to effect so that you are having nice easy bowel movements.  Otherwise if you increase your water intake that also will likely help with your bowel issues as well.  If you develop any black tarry stool or passing pure blood clots as discussed please return for evaluation.  I suspect the blood in the stool is from constipation.  Overall follow-up with your primary care doctor if you continue to have chest discomfort.  Recommend taking Tylenol 1000 mg every 6 hours as needed for discomfort to see if it helps.

## 2023-05-13 NOTE — ED Triage Notes (Signed)
Patient here POV from Home.  Endorses CP intermittently since Last PM. Noted some Blood in Stool Last PM that continued until about an hour ago. Some nausea that subsided. No Emesis. No Diarrhea. Some Straining.   Blood in Stool is more bright.   NAD noted during Triage. A&Ox4. Gcs 15. Ambulatory.

## 2023-05-13 NOTE — ED Notes (Signed)
Pt discharged to home using teachback Method. Discharge instructions have been discussed with patient and/or family members. Pt verbally acknowledges understanding d/c instructions, has been given opportunity for questions to be answered, and endorses comprehension to checkout at registration before leaving.  

## 2023-05-13 NOTE — Telephone Encounter (Signed)
Caller Name: Janiris Columbia Ph #: [phone] Chief Complaint: Chest pain starting at 5 pm 5/20 blood in stool this am. 5/21   This call was transferred to Nurse Triage/Access Nurse. This is for documentation purposes. No follow up required at this time.

## 2023-07-31 ENCOUNTER — Ambulatory Visit: Payer: 59 | Admitting: Orthopaedic Surgery

## 2023-08-14 ENCOUNTER — Ambulatory Visit: Payer: 59 | Admitting: Orthopaedic Surgery

## 2023-12-25 ENCOUNTER — Ambulatory Visit: Payer: 59 | Admitting: Nurse Practitioner

## 2023-12-25 ENCOUNTER — Telehealth: Payer: Self-pay | Admitting: Nurse Practitioner

## 2023-12-25 NOTE — Telephone Encounter (Signed)
 1.2.25 no show

## 2023-12-26 ENCOUNTER — Encounter (HOSPITAL_BASED_OUTPATIENT_CLINIC_OR_DEPARTMENT_OTHER): Payer: Self-pay | Admitting: Emergency Medicine

## 2023-12-26 DIAGNOSIS — M549 Dorsalgia, unspecified: Secondary | ICD-10-CM | POA: Insufficient documentation

## 2023-12-26 DIAGNOSIS — M79642 Pain in left hand: Secondary | ICD-10-CM | POA: Diagnosis not present

## 2023-12-26 DIAGNOSIS — Z5321 Procedure and treatment not carried out due to patient leaving prior to being seen by health care provider: Secondary | ICD-10-CM | POA: Insufficient documentation
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
## 2023-12-27 ENCOUNTER — Emergency Department (HOSPITAL_BASED_OUTPATIENT_CLINIC_OR_DEPARTMENT_OTHER)
Admission: EM | Admit: 2023-12-27 | Discharge: 2023-12-27 | Discharge status: Against Medical Advice | Payer: 59 | Attending: Emergency Medicine | Admitting: Emergency Medicine

## 2023-12-27 NOTE — ED Notes (Signed)
 Registration witnessed patient leave premises and not return.

## 2023-12-29 DIAGNOSIS — M5432 Sciatica, left side: Secondary | ICD-10-CM | POA: Diagnosis not present

## 2023-12-29 DIAGNOSIS — M5431 Sciatica, right side: Secondary | ICD-10-CM | POA: Diagnosis not present

## 2023-12-29 DIAGNOSIS — S46911A Strain of unspecified muscle, fascia and tendon at shoulder and upper arm level, right arm, initial encounter: Secondary | ICD-10-CM | POA: Diagnosis not present

## 2023-12-29 DIAGNOSIS — Z91010 Allergy to peanuts: Secondary | ICD-10-CM | POA: Diagnosis not present

## 2023-12-29 DIAGNOSIS — J45909 Unspecified asthma, uncomplicated: Secondary | ICD-10-CM | POA: Diagnosis not present

## 2023-12-29 DIAGNOSIS — F17210 Nicotine dependence, cigarettes, uncomplicated: Secondary | ICD-10-CM | POA: Diagnosis not present

## 2023-12-29 DIAGNOSIS — M5441 Lumbago with sciatica, right side: Secondary | ICD-10-CM | POA: Diagnosis not present

## 2023-12-29 DIAGNOSIS — M25561 Pain in right knee: Secondary | ICD-10-CM | POA: Diagnosis not present

## 2023-12-29 DIAGNOSIS — S39012A Strain of muscle, fascia and tendon of lower back, initial encounter: Secondary | ICD-10-CM | POA: Diagnosis not present

## 2023-12-29 DIAGNOSIS — M5442 Lumbago with sciatica, left side: Secondary | ICD-10-CM | POA: Diagnosis not present

## 2023-12-29 DIAGNOSIS — M549 Dorsalgia, unspecified: Secondary | ICD-10-CM | POA: Diagnosis not present

## 2023-12-29 DIAGNOSIS — M25511 Pain in right shoulder: Secondary | ICD-10-CM | POA: Diagnosis not present

## 2023-12-29 DIAGNOSIS — Z888 Allergy status to other drugs, medicaments and biological substances status: Secondary | ICD-10-CM | POA: Diagnosis not present

## 2023-12-30 ENCOUNTER — Telehealth: Payer: 59 | Admitting: Emergency Medicine

## 2023-12-30 ENCOUNTER — Encounter: Payer: Self-pay | Admitting: Nurse Practitioner

## 2023-12-30 DIAGNOSIS — M549 Dorsalgia, unspecified: Secondary | ICD-10-CM

## 2023-12-31 NOTE — Telephone Encounter (Signed)
 12/25/2023 1st no show since 04/2022, letter sent via [facility]

## 2024-01-01 ENCOUNTER — Telehealth: Payer: Self-pay

## 2024-01-01 NOTE — Transitions of Care (Post Inpatient/ED Visit) (Signed)
   01/01/2024  Name: Teresa Peterson MRN: [id number] DOB: [birth year]  Today's TOC FU Call Status: Today's TOC FU Call Status:: Unsuccessful Call (1st Attempt) Unsuccessful Call (1st Attempt) Date: 01/01/24  Attempted to reach the patient regarding the most recent Inpatient/ED visit.  Follow Up Plan: Additional outreach attempts will be made to reach the patient to complete the Transitions of Care (Post Inpatient/ED visit) call.   Signature Dedra Sorenson, BSN, [facility]

## 2024-01-01 NOTE — Telephone Encounter (Signed)
 Copied from CRM [id number]. Topic: General - Other >> Jan 01, 2024 10:30 AM Gurney Maxin H wrote: Reason for CRM: Patient returning call to [facility], please reach out to patient. Thank you  [place] [phone]

## 2024-01-05 ENCOUNTER — Ambulatory Visit (INDEPENDENT_AMBULATORY_CARE_PROVIDER_SITE_OTHER): Payer: Commercial Managed Care - PPO | Admitting: Nurse Practitioner

## 2024-01-05 ENCOUNTER — Ambulatory Visit: Payer: 59 | Admitting: Nurse Practitioner

## 2024-01-05 VITALS — BP 133/85 | HR 95 | Temp 98.3°F | Resp 18 | Ht 63.5 in | Wt 206.4 lb

## 2024-01-05 DIAGNOSIS — M545 Low back pain, unspecified: Secondary | ICD-10-CM | POA: Diagnosis not present

## 2024-01-05 DIAGNOSIS — G8929 Other chronic pain: Secondary | ICD-10-CM

## 2024-01-05 MED ORDER — CYCLOBENZAPRINE HCL 5 MG PO TABS: 5.0000 mg | ORAL_TABLET | Freq: Every day | ORAL | 0 refills | Status: DC

## 2024-01-05 MED ORDER — MELOXICAM 7.5 MG PO TABS: 7.5000 mg | ORAL_TABLET | Freq: Every day | ORAL | 0 refills | Status: DC

## 2024-01-05 NOTE — Progress Notes (Signed)
 Established Patient Visit  Patient: Teresa Peterson   DOB: [birth year]   37 y.o. Female  MRN: [id number] Visit Date: 01/05/2024  Subjective:    Chief Complaint  Patient presents with   Hospitalization Follow-up    PT C/O of back pain very sharp. PT used Tylenol  and Robaxin  for symptoms. She also needs FMLA paperwork filled out. RX depression medication request   HPI Chronic bilateral low back pain without sciatica Hx of intermittent back pain after fall several years ago, but worse in last 2months, worse with repetitive bending or lifting or twisting. Describes pain as spasm which radiates to bilateral upper extremities, no change in GI/GU function, no paresthesia, no weakness. She denies any recent injury/fall, no previous back surgery She was evaluated bu [facility] 12/29/23: normal lumbar spine x-ray, ibuprofen  and robaxin  prescribed. She takes med prn with moderate relief. She was long term disability form completed. She states, she is not eligible for intermittent FMLA.  Normal exam today. Advised Teresa Peterson that LTD forms could not be completed due to lack of documented proper back pain work-up or treatment. It is appropriate to provide work accomodation letter to allow for proper treatment. Advised about need for proper body mechanics and need for weight loss. Sent mobic  7.5mg , and flexeril  5mg  Advised to avoid OVER THE COUNTER NSAIDs while taking mobic  Entered referral for outpatient PT F/up in 6-8weeks  Wt Readings from Last 3 Encounters:  01/05/24 206 lb 6.4 oz (93.6 kg)  05/13/23 200 lb (90.7 kg)  04/09/22 195 lb (88.5 kg)    Reviewed medical, surgical, and social history today  Medications: Outpatient Medications Prior to Visit  Medication Sig   albuterol  (VENTOLIN  HFA) 108 (90 Base) MCG/ACT inhaler Inhale 1 puff into the lungs every 6 (six) hours as needed.   fluticasone  (FLONASE ) 50 MCG/ACT nasal spray Place 1 spray into  both nostrils 2 (two) times daily.   hydrOXYzine  (ATARAX ) 25 MG tablet Take 25 mg by mouth 3 (three) times daily as needed.   triamcinolone (NASACORT) 55 MCG/ACT AERO nasal inhaler USE 1 SPRAY(S) IN EACH NOSTRIL AS DIRECTED AS NEEDED   valACYclovir  (VALTREX ) 1000 MG tablet Take 1,000 mg by mouth 2 (two) times daily.   [DISCONTINUED] amoxicillin -clavulanate (AUGMENTIN ) 875-125 MG tablet Take 1 tablet by mouth every 12 (twelve) hours.   [DISCONTINUED] budesonide -formoterol  (SYMBICORT ) 80-4.5 MCG/ACT inhaler Inhale 2 puffs into the lungs 2 (two) times daily.   [DISCONTINUED] buPROPion  (WELLBUTRIN  XL) 150 MG 24 hr tablet Take 1 tablet (150 mg total) by mouth daily.   [DISCONTINUED] dicyclomine  (BENTYL ) 20 MG tablet Take 1 tablet (20 mg total) by mouth 2 (two) times daily.   [DISCONTINUED] famotidine  (PEPCID ) 20 MG tablet Take 1 tablet (20 mg total) by mouth 2 (two) times daily.   [DISCONTINUED] fexofenadine  (ALLEGRA  ALLERGY) 180 MG tablet Take 1 tablet (180 mg total) by mouth daily.   [DISCONTINUED] fexofenadine  (ALLEGRA  ALLERGY) 60 MG tablet Take 1 tablet (60 mg total) by mouth daily.   [DISCONTINUED] ibuprofen  (ADVIL ) 600 MG tablet Take 1 tablet (600 mg total) by mouth every 8 (eight) hours as needed. With food   [DISCONTINUED] methocarbamol  (ROBAXIN ) 500 MG tablet Take 500 mg by mouth 2 (two) times daily.   [DISCONTINUED] metroNIDAZOLE  (FLAGYL ) 500 MG tablet Take 1 tablet (500 mg total) by mouth 2 (two) times daily.   [DISCONTINUED] ondansetron  (ZOFRAN -ODT) 4 MG disintegrating tablet Take 1  tablet (4 mg total) by mouth every 8 (eight) hours as needed for nausea or vomiting.   No facility-administered medications prior to visit.   Reviewed past medical and social history.   ROS per HPI above      Objective:  BP 133/85 (BP Location: Right Arm, Patient Position: Sitting, Cuff Size: Large) Comment: recheck  Pulse 95   Temp 98.3 F (36.8 C) (Temporal)   Resp 18   Ht 5' 3.5 (1.613 m)   Wt  206 lb 6.4 oz (93.6 kg)   LMP 12/16/2023   SpO2 96%   BMI 35.99 kg/m      Physical Exam Vitals and nursing note reviewed.  Cardiovascular:     Rate and Rhythm: Normal rate.     Pulses: Normal pulses.  Pulmonary:     Effort: Pulmonary effort is normal.  Abdominal:     General: There is no distension.     Palpations: Abdomen is soft. There is no mass.     Tenderness: There is no guarding.  Musculoskeletal:     Cervical back: Normal.     Thoracic back: Normal.     Lumbar back: Normal.     Right hip: Normal.     Left hip: Normal.     Right upper leg: Normal.     Left upper leg: Normal.     Right knee: Normal.     Left knee: Normal.     Right lower leg: Normal. No edema.     Left lower leg: Normal. No edema.  Neurological:     Mental Status: She is alert and oriented to person, place, and time.     Motor: No weakness.     Gait: Gait is intact.     Deep Tendon Reflexes:     Reflex Scores:      Patellar reflexes are 2+ on the right side and 2+ on the left side.    No results found for any visits on 01/05/24.    Assessment & Plan:    Problem List Items Addressed This Visit     Chronic bilateral low back pain without sciatica - Primary   Hx of intermittent back pain after fall several years ago, but worse in last 2months, worse with repetitive bending or lifting or twisting. Describes pain as spasm which radiates to bilateral upper extremities, no change in GI/GU function, no paresthesia, no weakness. She denies any recent injury/fall, no previous back surgery She was evaluated bu [facility] 12/29/23: normal lumbar spine x-ray, ibuprofen  and robaxin  prescribed. She takes med prn with moderate relief. She was long term disability form completed. She states, she is not eligible for intermittent FMLA.  Normal exam today. Advised Teresa Peterson that LTD forms could not be completed due to lack of documented proper back pain work-up or treatment. It is  appropriate to provide work accomodation letter to allow for proper treatment. Advised about need for proper body mechanics and need for weight loss. Sent mobic  7.5mg , and flexeril  5mg  Advised to avoid OVER THE COUNTER NSAIDs while taking mobic  Entered referral for outpatient PT F/up in 6-8weeks      Relevant Medications   meloxicam  (MOBIC ) 7.5 MG tablet   cyclobenzaprine  (FLEXERIL ) 5 MG tablet   Other Relevant Orders   Ambulatory referral to Physical Therapy   Return in about 3 months (around 04/04/2024) for CPE (fasting) and back pain f/up.     Roselie Mood, NP

## 2024-01-05 NOTE — Patient Instructions (Addendum)
 Will complete for to indicate course of treatment and need for work accommodations. Start mobic  7.5mg  daily with food Avoid taking ibuprofen  or naproxen  or aleve  or advil  while taking mobic  Ok to use tylenol  500-650mg  every 8hrs as needed with mobic  7.5mg  Use flexeril  only at bedtime. Maintain proper body mechanics when lifting. You will be contacted to schedule appointment for physical therapy.  Chronic Back Pain Chronic back pain is back pain that lasts longer than 3 months. The cause of your back pain may not be known. Some common causes include: Wear and tear (degenerative disease) of the bones, disks, or tissues that connect bones to each other (ligaments) in your back. Inflammation and stiffness in your back (arthritis). If you have chronic back pain, you may have times when the pain is more intense (flare-ups). You can also learn to manage the pain with home care. Follow these instructions at home: Watch for any changes in your symptoms. Take these actions to help with your pain: Managing pain and stiffness     If told, put ice on the painful area. You may be told to apply ice for the first 24-48 hours after a flare-up starts. Put ice in a plastic bag. Place a towel between your skin and the bag. Leave the ice on for 20 minutes, 2-3 times per day. If told, apply heat to the affected area as often as told by your health care provider. Use the heat source that your provider recommends, such as a moist heat pack or a heating pad. Place a towel between your skin and the heat source. Leave the heat on for 20-30 minutes. If your skin turns bright red, remove the ice or heat right away to prevent skin damage. The risk of damage is higher if you cannot feel pain, heat, or cold. Try soaking in a warm tub. Activity        Avoid bending and other activities that make the pain worse. Have good posture when you stand or sit. When you stand, keep your upper back and neck straight, with  your shoulders pulled back. Avoid slouching. When you sit, keep your back straight. Relax your shoulders. Do not round your shoulders or pull them backward. Do not sit or stand in one place for too long. Take brief periods of rest during the day. This will reduce your pain. Resting in a lying or standing position is often better than sitting to rest. When you rest for longer periods, mix in some mild activity or stretching between periods of rest. This will help to prevent stiffness and pain. Get regular exercise. Ask your provider what activities are safe for you. You may have to avoid lifting. Ask your provider how much you can safely lift. If you do lift, always use the right technique. This means you should: Bend your knees. Keep the load close to your body. Avoid twisting. Medicines Take over-the-counter and prescription medicines only as told by your provider. You may need to take medicines for pain and inflammation. These may be taken by mouth or put on the skin. You may also be given muscle relaxants. Ask your provider if the medicine prescribed to you: Requires you to avoid driving or using machinery. Can cause constipation. You may need to take these actions to prevent or treat constipation: Drink enough fluid to keep your pee (urine) pale yellow. Take over-the-counter or prescription medicines. Eat foods that are high in fiber, such as beans, whole grains, and fresh fruits and vegetables. Limit  foods that are high in fat and processed sugars, such as fried or sweet foods. General instructions  Sleep on a firm mattress in a comfortable position. Try lying on your side with your knees slightly bent. If you lie on your back, put a pillow under your knees. Do not use any products that contain nicotine or tobacco. These products include cigarettes, chewing tobacco, and vaping devices, such as e-cigarettes. If you need help quitting, ask your provider. Contact a health care provider  if: You have pain that does not get better with rest or medicine. You have new pain. You have a fever. You lose weight quickly. You have trouble doing your normal activities. You feel weak or numb in one or both of your legs or feet. Get help right away if: You are not able to control when you pee or poop. You have severe back pain and: Nausea or vomiting. Pain in your chest or abdomen. Shortness of breath. You faint. These symptoms may be an emergency. Get help right away. Call 911. Do not wait to see if the symptoms will go away. Do not drive yourself to the hospital. This information is not intended to replace advice given to you by your health care provider. Make sure you discuss any questions you have with your health care provider. Document Revised: 07/29/2022 Document Reviewed: 07/29/2022 Elsevier Patient Education  2024 [employer].

## 2024-01-05 NOTE — Progress Notes (Signed)
 Because you have had this problem for over a week and were seen in the [place] ER for this, I am not able to help you by evisit. I feel your condition warrants further evaluation and I recommend that you be seen for a face to face visit with your primary care practice. Please contact Ms. Nche's office to be seen. Many offices offer virtual options to be seen via video if you are not comfortable going in person to a medical facility at this time.  Alternatively, the ER note shows they referred you to an orthopedist - you can also follow up there.   NOTE: You will NOT be charged for this eVisit.  If you do not have a PCP, [facility] offers a free physician referral service available at [phone]. Our trained staff has the experience, knowledge and resources to put you in touch with a physician who is right for you.    If you are having a true medical emergency please call 911.   Your e-visit answers were reviewed by a board certified advanced clinical practitioner to complete your personal care plan.  Thank you for using e-Visits.

## 2024-01-05 NOTE — Assessment & Plan Note (Addendum)
 Hx of intermittent back pain after fall several years ago, but worse in last 2months, worse with repetitive bending or lifting or twisting. Describes pain as spasm which radiates to bilateral upper extremities, no change in GI/GU function, no paresthesia, no weakness. She denies any recent injury/fall, no previous back surgery She was evaluated bu [facility] 12/29/23: normal lumbar spine x-ray, ibuprofen  and robaxin  prescribed. She takes med prn with moderate relief. She was long term disability form completed. She states, she is not eligible for intermittent FMLA.  Normal exam today. Advised Teresa Peterson that LTD forms could not be completed due to lack of documented proper back pain work-up or treatment. It is appropriate to provide work accomodation letter to allow for proper treatment. Advised about need for proper body mechanics and need for weight loss. Sent mobic  7.5mg , and flexeril  5mg  Advised to avoid OVER THE COUNTER NSAIDs while taking mobic  Entered referral for outpatient PT F/up in 6-8weeks

## 2024-01-05 NOTE — Transitions of Care (Post Inpatient/ED Visit) (Signed)
   01/05/2024  Name: Teresa Peterson MRN: [id number] DOB: March 07, 1986  Today's TOC FU Call Status: Today's TOC FU Call Status:: Unsuccessful Call (2nd Attempt) Unsuccessful Call (1st Attempt) Date: 01/01/24 Unsuccessful Call (2nd Attempt) Date: 01/05/24  Attempted to reach the patient regarding the most recent Inpatient/ED visit.  Follow Up Plan: Additional outreach attempts will be made to reach the patient to complete the Transitions of Care (Post Inpatient/ED visit) call.   Signature Dedra Sorenson, BSN, [employer]ISER

## 2024-01-07 NOTE — Transitions of Care (Post Inpatient/ED Visit) (Signed)
   01/07/2024  Name: Teresa Peterson MRN: [id number] DOB: 1986/08/01  Today's TOC FU Call Status: Today's TOC FU Call Status:: Unsuccessful Call (3rd Attempt) Unsuccessful Call (1st Attempt) Date: 01/01/24 Unsuccessful Call (2nd Attempt) Date: 01/05/24 Unsuccessful Call (3rd Attempt) Date: 01/07/24  Attempted to reach the patient regarding the most recent Inpatient/ED visit.  Follow Up Plan: No further outreach attempts will be made at this time. We have been unable to contact the patient.  Signature Alphonsa Jasper, BSN, Charity fundraiser

## 2024-01-08 ENCOUNTER — Ambulatory Visit: Payer: Commercial Managed Care - PPO | Admitting: Nurse Practitioner

## 2024-01-14 NOTE — Therapy (Unsigned)
OUTPATIENT PHYSICAL THERAPY THORACOLUMBAR EVALUATION   Patient Name: Teresa Peterson MRN: [id number] DOB:06/19/1986, 38 y.o., female Today's Date: 01/15/2024  END OF SESSION:  PT End of Session - 01/15/24 1257     Visit Number 1    Date for PT Re-Evaluation 03/25/24    PT Start Time 1148    PT Stop Time 1220    PT Time Calculation (min) 32 min    Activity Tolerance Patient tolerated treatment well    Behavior During Therapy [facility] for tasks assessed/performed             Past Medical History:  Diagnosis Date   Allergy    Anemia 01/01/1988   Born anemic, grew out of it   Asthma    Depression    Emphysema of lung (HCC)    GERD (gastroesophageal reflux disease)    Headache(784.0)    Migraines w/Aura   Heart murmur    HSV infection    Hx of vaginal herpes   Noncompliance with medications    STD (sexually transmitted disease) 02/11/2022   Tx'd for chlamydia x3   History reviewed. No pertinent surgical history. Patient Active Problem List   Diagnosis Date Noted   Chronic bilateral low back pain without sciatica 01/05/2024   Severe episode of recurrent major depressive disorder, with psychotic features (HCC) 03/22/2022   Right shoulder tendonitis 03/22/2022   Asthma 11/08/2011   Tinea versicolor 11/08/2011    PCP: Anne Ng, NP   REFERRING PROVIDER: Anne Ng, NP   REFERRING DIAG: M54.50,G89.29 (ICD-10-CM) - Chronic bilateral low back pain without sciatica   Rationale for Evaluation and Treatment: Rehabilitation  THERAPY DIAG:  Difficulty in walking, not elsewhere classified  Muscle weakness (generalized)  Other lack of coordination  Chronic bilateral low back pain with bilateral sciatica  ONSET DATE: 01/05/24  SUBJECTIVE:                                                                                                                                                                                           SUBJECTIVE  STATEMENT: Patient reports H/O LBP since November. Starts on L, but pain is worse on R, goes into knees. Has trouble lifting her arm on R.  PERTINENT HISTORY:  Per referring provider note: Hx of intermittent back pain after fall 10 years ago, but worse in last 2months, worse with repetitive bending or lifting or twisting.   PAIN:  Are you having pain? Yes: NPRS scale: 10/10 Pain location: Starts in low back, spreads into both legs Pain description: squeezing, stabbing, straining Aggravating factors: taking a wrong step, lifting sometimes, temperature affects. Relieving  factors: Ice or heat help, elevating her legs  PRECAUTIONS: Back and Fall  RED FLAGS: None   WEIGHT BEARING RESTRICTIONS: No  FALLS:  Has patient fallen in last 6 months? 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  NEXT MD VISIT: about April 04, 2024  OBJECTIVE:  Note: Objective measures were completed at Evaluation unless otherwise noted.  DIAGNOSTIC FINDINGS:  12/29/23: normal lumbar spine x-ray   PATIENT SURVEYS:  Modified Oswestry 52%   COGNITION: Overall cognitive status: Within functional limits for tasks assessed     SENSATION: Patient denies any sensory changes.  MUSCLE LENGTH: Hamstrings: Right 75 deg; Left 75 deg Thomas test: WNL B  POSTURE: increased lumbar lordosis, anterior pelvic tilt, and B knees locked and pigeon toed.  PALPATION: Mild TTP on R QL, BITB  LUMBAR ROM:   AROM eval  Flexion To toes, stretch  Extension 100%, P  Right lateral flexion 100%P   Left lateral flexion 100%  Right rotation 80%  Left rotation 80%   (Blank rows = not tested)  LOWER EXTREMITY ROM:   WNL BLE   LOWER EXTREMITY MMT:    MMT Right eval Left eval  Hip flexion 4- 4-  Hip extension 4- 4-  Hip abduction 4- 4  Hip adduction    Hip internal rotation    Hip external rotation    Knee flexion 4- 4-  Knee extension 4- 4-  Ankle dorsiflexion 4 4  Ankle plantarflexion    Ankle inversion    Ankle eversion     (Blank rows = not tested)  LUMBAR SPECIAL TESTS:  Straight leg raise test: Negative, Slump test: Negative, Single leg stance test: Negative, and FABER test: Negative  FUNCTIONAL TESTS:  SLS 6 sec L, 5 sec R  GAIT: Distance walked: In clinic distances Assistive device utilized: None Level of assistance: Complete Independence Comments: Antalgic, loud heel strike B  TREATMENT DATE:  01/15/24                                                                                                                              Education  PATIENT EDUCATION:  Education details: POC Person educated: Patient Education method: Explanation Education comprehension: verbalized understanding  HOME EXERCISE PROGRAM: TBD  ASSESSMENT:  CLINICAL IMPRESSION: Patient is a 38 y.o. who was seen today for physical therapy evaluation and treatment for chronic LBP, with a recent exacerbation 3 months ago which was severe. She reports B sciatic symptoms and sometimes going up into her R shoulder. Neural testing was largely negative. SI  screen was negative today, but her description of the pain may point to some instability. She has weakness in BLE and trunk, which may also contribute to some instability in the pelvis, with intermittent exacerbations. She will benefit from PT to facilitate improved stability and strength as well as to ensure she is using proper body mechanics at work, which involves lifting trays, reaching low and high with them.  OBJECTIVE IMPAIRMENTS:  Abnormal gait, decreased activity tolerance, decreased balance, decreased coordination, decreased endurance, difficulty walking, decreased ROM, decreased strength, increased muscle spasms, impaired flexibility, postural dysfunction, and pain.   ACTIVITY LIMITATIONS: carrying, lifting, bending, squatting, and locomotion level  PARTICIPATION LIMITATIONS: meal prep, cleaning, laundry, shopping, community activity, and occupation  PERSONAL FACTORS: Past/current experiences are also affecting patient's functional outcome.   REHAB POTENTIAL: Good  CLINICAL DECISION MAKING: Stable/uncomplicated  EVALUATION COMPLEXITY: Low   GOALS: Goals reviewed with patient? Yes  SHORT TERM GOALS: Target date: 02/02/24  I with initial HEP Baseline: Goal status: INITIAL  LONG TERM GOALS: Target date: 03/25/24  I with final HEP Baseline:  Goal status: INITIAL  2.  Decrease Owestry score to < 21% for minimal impairment Baseline:  Goal status: INITIAL  3.  Patient will report at least 50% improvement in her back pain Baseline:  Goal status: INITIAL  3.  Patient will report the ability to complete her daily activities, including work, with back pain < 3/10 Baseline:  Goal status: INITIAL  4.  Patient will demonstrate proper body mechanics while lifting from low surfaces, turning and reaching while holding objects of about 5# Baseline:  Goal status: INITIAL  5.  Increase her LE strength to 4+/5 Baseline:  Goal status: INITIAL  PLAN:  PT FREQUENCY: 1-2x/week  PT DURATION: 10 weeks  PLANNED INTERVENTIONS: 97110-Therapeutic exercises, 97530- Therapeutic activity, [id number]- Neuromuscular re-education, 97535- Self Care, 40981- Manual therapy, [id number]- Gait training, 97014- Electrical stimulation (unattended), [id number]- Traction (mechanical), [id number]- Ionotophoresis 4mg /ml Dexamethasone, Patient/Family education, Taping, Dry Needling, Joint mobilization, Spinal mobilization, Cryotherapy, and Moist  heat.  PLAN FOR NEXT SESSION: HEP   Iona Beard, DPT 01/15/2024, 1:11 PM

## 2024-01-15 ENCOUNTER — Ambulatory Visit: Payer: Commercial Managed Care - PPO | Attending: Nurse Practitioner | Admitting: Physical Therapy

## 2024-01-15 ENCOUNTER — Encounter: Payer: Self-pay | Admitting: Physical Therapy

## 2024-01-15 DIAGNOSIS — M6281 Muscle weakness (generalized): Secondary | ICD-10-CM | POA: Insufficient documentation

## 2024-01-15 DIAGNOSIS — M5441 Lumbago with sciatica, right side: Secondary | ICD-10-CM | POA: Insufficient documentation

## 2024-01-15 DIAGNOSIS — R262 Difficulty in walking, not elsewhere classified: Secondary | ICD-10-CM | POA: Insufficient documentation

## 2024-01-15 DIAGNOSIS — M545 Low back pain, unspecified: Secondary | ICD-10-CM | POA: Insufficient documentation

## 2024-01-15 DIAGNOSIS — M5442 Lumbago with sciatica, left side: Secondary | ICD-10-CM | POA: Insufficient documentation

## 2024-01-15 DIAGNOSIS — R278 Other lack of coordination: Secondary | ICD-10-CM | POA: Diagnosis not present

## 2024-01-15 DIAGNOSIS — G8929 Other chronic pain: Secondary | ICD-10-CM | POA: Insufficient documentation

## 2024-01-19 ENCOUNTER — Telehealth: Payer: Self-pay | Admitting: Nurse Practitioner

## 2024-01-19 NOTE — Telephone Encounter (Signed)
Patient is requesting a transfer from [place] to [place].   Patient will hold appt with Claris Gower until [facility] is approved.   Please advise.

## 2024-01-19 NOTE — Telephone Encounter (Signed)
I have cancelled appt with Nche.  Patient has been informed.

## 2024-01-19 NOTE — Telephone Encounter (Signed)
Pt scheduled a New Pt appt with [facility] but is already established with Nche. LVM with pt to confirm whether they are wanting to transfer care to our office. If so, a msg needs to be sent to both providers.

## 2024-01-21 ENCOUNTER — Ambulatory Visit: Payer: Commercial Managed Care - PPO | Admitting: Physical Therapy

## 2024-01-24 ENCOUNTER — Emergency Department (HOSPITAL_BASED_OUTPATIENT_CLINIC_OR_DEPARTMENT_OTHER): Payer: Commercial Managed Care - PPO | Admitting: Radiology

## 2024-01-24 ENCOUNTER — Encounter (HOSPITAL_BASED_OUTPATIENT_CLINIC_OR_DEPARTMENT_OTHER): Payer: Self-pay

## 2024-01-24 ENCOUNTER — Emergency Department (HOSPITAL_BASED_OUTPATIENT_CLINIC_OR_DEPARTMENT_OTHER)
Admission: EM | Admit: 2024-01-24 | Discharge: 2024-01-24 | Disposition: A | Discharge status: Home / Self Care | Payer: Commercial Managed Care - PPO | Attending: Emergency Medicine | Admitting: Emergency Medicine

## 2024-01-24 ENCOUNTER — Other Ambulatory Visit: Payer: Self-pay

## 2024-01-24 DIAGNOSIS — R0602 Shortness of breath: Secondary | ICD-10-CM | POA: Diagnosis not present

## 2024-01-24 DIAGNOSIS — Z20822 Contact with and (suspected) exposure to covid-19: Secondary | ICD-10-CM | POA: Insufficient documentation

## 2024-01-24 DIAGNOSIS — Z7951 Long term (current) use of inhaled steroids: Secondary | ICD-10-CM | POA: Insufficient documentation

## 2024-01-24 DIAGNOSIS — J4521 Mild intermittent asthma with (acute) exacerbation: Secondary | ICD-10-CM | POA: Diagnosis not present

## 2024-01-24 DIAGNOSIS — R079 Chest pain, unspecified: Secondary | ICD-10-CM | POA: Diagnosis not present

## 2024-01-24 LAB — RESP PANEL BY RT-PCR (RSV, FLU A&B, COVID)  RVPGX2
Influenza A by PCR: NEGATIVE
Influenza B by PCR: NEGATIVE
Resp Syncytial Virus by PCR: NEGATIVE
SARS Coronavirus 2 by RT PCR: NEGATIVE

## 2024-01-24 LAB — BASIC METABOLIC PANEL
Anion gap: 6 (ref 5–15)
BUN: 10 mg/dL (ref 6–20)
CO2: 28 mmol/L (ref 22–32)
Calcium: 8.3 mg/dL — ABNORMAL LOW (ref 8.9–10.3)
Chloride: 106 mmol/L (ref 98–111)
Creatinine, Ser: 0.75 mg/dL (ref 0.44–1.00)
GFR, Estimated: >60 mL/min (ref >60)
Glucose, Bld: 95 mg/dL (ref 70–99)
Potassium: 3.9 mmol/L (ref 3.5–5.1)
Sodium: 140 mmol/L (ref 135–145)

## 2024-01-24 LAB — TROPONIN I (HIGH SENSITIVITY): Troponin I (High Sensitivity): 5 ng/L (ref <18)

## 2024-01-24 LAB — CBC
HCT: 38.8 % (ref 36.0–46.0)
Hemoglobin: 12.2 g/dL (ref 12.0–15.0)
MCH: 28.6 pg (ref 26.0–34.0)
MCHC: 31.4 g/dL (ref 30.0–36.0)
MCV: 91.1 fL (ref 80.0–100.0)
Platelets: 320 10*3/uL (ref 150–400)
RBC: 4.26 MIL/uL (ref 3.87–5.11)
RDW: 13.7 % (ref 11.5–15.5)
WBC: 8.6 10*3/uL (ref 4.0–10.5)
nRBC: 0 % (ref 0.0–0.2)

## 2024-01-24 MED ORDER — ALBUTEROL SULFATE HFA 108 (90 BASE) MCG/ACT IN AERS: 1.0000 | INHALATION_SPRAY | Freq: Four times a day (QID) | RESPIRATORY_TRACT | 0 refills | Status: DC | PRN

## 2024-01-24 MED ORDER — ALBUTEROL SULFATE (2.5 MG/3ML) 0.083% IN NEBU
INHALATION_SOLUTION | RESPIRATORY_TRACT | Status: AC
  Administered 2024-01-24: 2.5 mg
  Filled 2024-01-24: qty 3

## 2024-01-24 MED ORDER — PREDNISONE 50 MG PO TABS
60.0000 mg | ORAL_TABLET | Freq: Once | ORAL | Status: AC
  Administered 2024-01-24: 60 mg via ORAL
  Filled 2024-01-24: qty 1

## 2024-01-24 MED ORDER — IPRATROPIUM-ALBUTEROL 0.5-2.5 (3) MG/3ML IN SOLN
RESPIRATORY_TRACT | Status: AC
  Filled 2024-01-24: qty 3

## 2024-01-24 MED ORDER — PREDNISONE 10 MG PO TABS: 20.0000 mg | ORAL_TABLET | Freq: Every day | ORAL | 0 refills | Status: DC

## 2024-01-24 NOTE — ED Triage Notes (Signed)
Pt POV from home reporting SOB and chest tightness since Tuesday. Hx asthma, audible wheezing in triage, out of treatments at home.

## 2024-01-24 NOTE — ED Provider Notes (Signed)
[place] EMERGENCY DEPARTMENT AT [facility] Provider Note   CSN: [id number] Arrival date & time: 01/24/24  6578     History  Chief Complaint  Patient presents with   Shortness of Breath   Chest Pain    Teresa Peterson is a 38 y.o. female.  The history is provided by the patient.  Wheezing Severity:  Moderate Severity compared to prior episodes:  Similar Onset quality:  Gradual Duration:  5 days Timing:  Constant Progression:  Unchanged Chronicity:  New Context: not dust, not emotional upset and not exercise   Relieved by:  Nothing Worsened by:  Nothing Ineffective treatments:  None tried Associated symptoms: chest tightness   Associated symptoms: no fever, no sore throat, no sputum production, no stridor and no swollen glands   Risk factors: not exposed to toxic fumes        Home Medications Prior to Admission medications   Medication Sig Start Date End Date Taking? Authorizing Provider  albuterol (VENTOLIN HFA) 108 (90 Base) MCG/ACT inhaler Inhale 1-2 puffs into the lungs every 6 (six) hours as needed for wheezing or shortness of breath. 01/24/24  Yes Crescentia Boutwell, MD  predniSONE (DELTASONE) 10 MG tablet Take 2 tablets (20 mg total) by mouth daily. 01/24/24  Yes Vail Vuncannon, MD  albuterol (VENTOLIN HFA) 108 (90 Base) MCG/ACT inhaler Inhale 1 puff into the lungs every 6 (six) hours as needed.    [provider]  cyclobenzaprine (FLEXERIL) 5 MG tablet Take 1 tablet (5 mg total) by mouth at bedtime. 01/05/24   Nche, Bonna Gains, NP  fluticasone (FLONASE) 50 MCG/ACT nasal spray Place 1 spray into both nostrils 2 (two) times daily. 11/19/22   Particia Nearing, PA-C  hydrOXYzine (ATARAX) 25 MG tablet Take 25 mg by mouth 3 (three) times daily as needed.    [provider]  meloxicam (MOBIC) 7.5 MG tablet Take 1 tablet (7.5 mg total) by mouth daily. With food 01/05/24   Nche, Bonna Gains, NP  triamcinolone (NASACORT) 55 MCG/ACT AERO  nasal inhaler USE 1 SPRAY(S) IN EACH NOSTRIL AS DIRECTED AS NEEDED 05/03/20   [provider]  valACYclovir (VALTREX) 1000 MG tablet Take 1,000 mg by mouth 2 (two) times daily.    [provider]      Allergies    Peanut oil, Pseudoephedrine hcl, and Sudafed [pseudoephedrine hcl]    Review of Systems   Review of Systems  Constitutional:  Negative for fever.  HENT:  Negative for sore throat.   Eyes:  Negative for redness.  Respiratory:  Positive for chest tightness and wheezing. Negative for sputum production and stridor.   All other systems reviewed and are negative.   Physical Exam Updated Vital Signs BP (!) 147/88   Pulse 87   Temp 97.6 F (36.4 C) (Temporal)   Resp 20   Ht 5\' 3"  (1.6 m)   Wt 93.4 kg   LMP 12/16/2023   SpO2 99%   BMI 36.49 kg/m  Physical Exam Vitals and nursing note reviewed.  Constitutional:      General: She is not in acute distress.    Appearance: She is well-developed.  HENT:     Head: Normocephalic and atraumatic.     Nose: Congestion present.  Eyes:     Pupils: Pupils are equal, round, and reactive to light.  Cardiovascular:     Rate and Rhythm: Normal rate and regular rhythm.     Pulses: Normal pulses.     Heart sounds:  Normal heart sounds.  Pulmonary:     Effort: Pulmonary effort is normal. No respiratory distress.     Breath sounds: Normal breath sounds. No wheezing or rales.     Comments: Post treatment  Abdominal:     General: Bowel sounds are normal. There is no distension.     Palpations: Abdomen is soft.     Tenderness: There is no abdominal tenderness. There is no guarding or rebound.  Musculoskeletal:        General: Normal range of motion.     Cervical back: Neck supple.  Skin:    General: Skin is warm and dry.     Capillary Refill: Capillary refill takes less than 2 seconds.     Findings: No erythema or rash.  Neurological:     General: No focal deficit present.     Deep Tendon Reflexes: Reflexes normal.   Psychiatric:        Mood and Affect: Mood normal.     ED Results / Procedures / Treatments   Labs (all labs ordered are listed, but only abnormal results are displayed) Results for orders placed or performed during the hospital encounter of 01/24/24  Resp panel by RT-PCR (RSV, Flu A&B, Covid) Anterior Nasal Swab   Collection Time: 01/24/24 12:53 AM   Specimen: Anterior Nasal Swab  Result Value Ref Range   SARS Coronavirus 2 by RT PCR NEGATIVE NEGATIVE   Influenza A by PCR NEGATIVE NEGATIVE   Influenza B by PCR NEGATIVE NEGATIVE   Resp Syncytial Virus by PCR NEGATIVE NEGATIVE  Basic metabolic panel   Collection Time: 01/24/24 12:53 AM  Result Value Ref Range   Sodium 140 135 - 145 mmol/L   Potassium 3.9 3.5 - 5.1 mmol/L   Chloride 106 98 - 111 mmol/L   CO2 28 22 - 32 mmol/L   Glucose, Bld 95 70 - 99 mg/dL   BUN 10 6 - 20 mg/dL   Creatinine, Ser 7.25 0.44 - 1.00 mg/dL   Calcium 8.3 (L) 8.9 - 10.3 mg/dL   GFR, Estimated >36 >64 mL/min   Anion gap 6 5 - 15  CBC   Collection Time: 01/24/24 12:53 AM  Result Value Ref Range   WBC 8.6 4.0 - 10.5 K/uL   RBC 4.26 3.87 - 5.11 MIL/uL   Hemoglobin 12.2 12.0 - 15.0 g/dL   HCT 40.3 47.4 - 25.9 %   MCV 91.1 80.0 - 100.0 fL   MCH 28.6 26.0 - 34.0 pg   MCHC 31.4 30.0 - 36.0 g/dL   RDW 56.3 87.5 - 64.3 %   Platelets 320 150 - 400 K/uL   nRBC 0.0 0.0 - 0.2 %  Troponin I (High Sensitivity)   Collection Time: 01/24/24 12:53 AM  Result Value Ref Range   Troponin I (High Sensitivity) 5 <18 ng/L   DG Chest 2 View Result Date: 01/24/2024 CLINICAL DATA:  Chest pain and shortness of breath EXAM: CHEST - 2 VIEW COMPARISON:  05/13/2023 FINDINGS: The heart size and mediastinal contours are within normal limits. Both lungs are clear. The visualized skeletal structures are unremarkable. IMPRESSION: No active cardiopulmonary disease. Electronically Signed   By: Minerva Fester M.D.   On: 01/24/2024 01:54    EKG EKG  Interpretation Date/Time:  Saturday January 24 2024 00:58:22 EST Ventricular Rate:  96 PR Interval:  124 QRS Duration:  72 QT Interval:  346 QTC Calculation: 437 R Axis:   88  Text Interpretation: Normal sinus rhythm Confirmed by Nicanor Alcon, [place] Territo (  [id number]) on 01/24/2024 2:16:05 AM  Radiology DG Chest 2 View Result Date: 01/24/2024 CLINICAL DATA:  Chest pain and shortness of breath EXAM: CHEST - 2 VIEW COMPARISON:  05/13/2023 FINDINGS: The heart size and mediastinal contours are within normal limits. Both lungs are clear. The visualized skeletal structures are unremarkable. IMPRESSION: No active cardiopulmonary disease. Electronically Signed   By: Minerva Fester M.D.   On: 01/24/2024 01:54    Procedures Procedures    Medications Ordered in ED Medications  ipratropium-albuterol (DUONEB) 0.5-2.5 (3) MG/3ML nebulizer solution (  Not Given 01/24/24 0251)  predniSONE (DELTASONE) tablet 60 mg (has no administration in time range)  albuterol (PROVENTIL) (2.5 MG/3ML) 0.083% nebulizer solution (2.5 mg  Given 01/24/24 0245)    ED Course/ Medical Decision Making/ A&P                                 Medical Decision Making Patient with asthma presents with ongoing attack since Monday.    Amount and/or Complexity of Data Reviewed External Data Reviewed: notes.    Details: Previous notes reviewed  Labs: ordered.    Details: Negative covid and flu. Normal white count 8.6, normal hemoglobin 12.2, normal platelets.  Normal troponin 5, Normal sodium 140, normal potassium 3.9, normal creatinine 0.75  Radiology: ordered and independent interpretation performed.    Details: Normal   Risk Prescription drug management. Risk Details: This is not cardiac, this is all secondary to asthma.  Has ruled out for MI.  Heart score is 1 very low risk for MACE.  Given nebulizer treatment and lungs are now clear.  Will refill inhaler and initiate steroids.  Have advised anti-histamine.  Stable for discharge.  Strict  returns.      Final Clinical Impression(s) / ED Diagnoses Final diagnoses:  Mild intermittent asthma with exacerbation   Return for intractable cough, coughing up blood, fevers > 100.4 unrelieved by medication, shortness of breath, intractable vomiting, chest pain, shortness of breath, weakness, numbness, changes in speech, facial asymmetry, abdominal pain, passing out, Inability to tolerate liquids or food, cough, altered mental status or any concerns. No signs of systemic illness or infection. The patient is nontoxic-appearing on exam and vital signs are within normal limits.  I have reviewed the triage vital signs and the nursing notes. Pertinent labs & imaging results that were available during my care of the patient were reviewed by me and considered in my medical decision making (see chart for details). After history, exam, and medical workup I feel the patient has been appropriately medically screened and is safe for discharge home. Pertinent diagnoses were discussed with the patient. Patient was given return precautions.  Rx / DC Orders ED Discharge Orders          Ordered    predniSONE (DELTASONE) 10 MG tablet  Daily        01/24/24 0324    albuterol (VENTOLIN HFA) 108 (90 Base) MCG/ACT inhaler  Every 6 hours PRN        01/24/24 0324              Ritisha Deitrick, MD 01/24/24 6045

## 2024-01-26 ENCOUNTER — Telehealth: Payer: Self-pay

## 2024-01-26 NOTE — Transitions of Care (Post Inpatient/ED Visit) (Signed)
   01/26/2024  Name: Teresa Peterson MRN: [id number] DOB: [birth year]  Today's TOC FU Call Status: Today's TOC FU Call Status:: Unsuccessful Call (1st Attempt) Unsuccessful Call (1st Attempt) Date: 01/26/24  Attempted to reach the patient regarding the most recent Inpatient/ED visit.  Follow Up Plan: Additional outreach attempts will be made to reach the patient to complete the Transitions of Care (Post Inpatient/ED visit) call.   Signature Jenny Reichmann

## 2024-01-29 ENCOUNTER — Ambulatory Visit: Payer: Commercial Managed Care - PPO | Attending: Nurse Practitioner

## 2024-01-29 DIAGNOSIS — M5441 Lumbago with sciatica, right side: Secondary | ICD-10-CM | POA: Insufficient documentation

## 2024-01-29 DIAGNOSIS — M6281 Muscle weakness (generalized): Secondary | ICD-10-CM | POA: Insufficient documentation

## 2024-01-29 DIAGNOSIS — R262 Difficulty in walking, not elsewhere classified: Secondary | ICD-10-CM | POA: Insufficient documentation

## 2024-01-29 DIAGNOSIS — R278 Other lack of coordination: Secondary | ICD-10-CM | POA: Diagnosis not present

## 2024-01-29 DIAGNOSIS — M5442 Lumbago with sciatica, left side: Secondary | ICD-10-CM | POA: Insufficient documentation

## 2024-01-29 DIAGNOSIS — G8929 Other chronic pain: Secondary | ICD-10-CM | POA: Diagnosis not present

## 2024-01-29 NOTE — Therapy (Signed)
 OUTPATIENT PHYSICAL THERAPY THORACOLUMBAR TREATMENT   Patient Name: Teresa Peterson MRN: [id number] DOB:08/02/1986, 38 y.o., female Today's Date: 01/29/2024  END OF SESSION:    Past Medical History:  Diagnosis Date   Allergy    Anemia 1987[birth year]12-29   Born anemic, grew out of it   Asthma    Depression    Emphysema of lung (HCC)    GERD (gastroesophageal reflux disease)    Headache(784.0)    Migraines w/Aura   Heart murmur    HSV infection    Hx of vaginal herpes   Noncompliance with medications    STD (sexually transmitted disease) 02/11/2022   Tx'd for chlamydia x3   No past surgical history on file. Patient Active Problem List   Diagnosis Date Noted   Chronic bilateral low back pain without sciatica 01/05/2024   Severe episode of recurrent major depressive disorder, with psychotic features (HCC) 03/22/2022   Right shoulder tendonitis 03/22/2022   Asthma 11/08/2011   Tinea versicolor 11/08/2011    PCP: Katheen Roselie Rockford, NP   REFERRING PROVIDER: Katheen Roselie Rockford, NP   REFERRING DIAG: M54.50,G89.29 (ICD-10-CM) - Chronic bilateral low back pain without sciatica   Rationale for Evaluation and Treatment: Rehabilitation  THERAPY DIAG:  No diagnosis found.  ONSET DATE: 01/05/24  SUBJECTIVE:                                                                                                                                                                                           SUBJECTIVE STATEMENT: Patient reports H/O LBP since November. Starts on L, but pain is worse on R, goes into knees. Has trouble lifting her arm on R. Pt stated her pain just began immediately prior to the session.  PERTINENT HISTORY:  Per referring provider note: Hx of intermittent back pain after fall 10 years ago, but worse in last 2months, worse with repetitive bending or lifting or twisting.   PAIN:  Are you having pain? Yes: NPRS scale: 2/10 Pain location: Starts in low back,  spreads into both legs Pain description: squeezing, stabbing, straining Aggravating factors: taking a wrong step, lifting sometimes, temperature affects. Relieving factors: Ice or heat help, elevating her legs  PRECAUTIONS: Back and Fall  RED FLAGS: None   WEIGHT BEARING RESTRICTIONS: No  FALLS:  Has patient fallen in last 6 months? 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  NEXT MD VISIT: about April 04, 2024  OBJECTIVE:  Note: Objective measures were completed at Evaluation unless otherwise noted.  DIAGNOSTIC FINDINGS:  12/29/23: normal lumbar spine x-ray   PATIENT SURVEYS:  Modified Oswestry 52%   COGNITION: Overall cognitive status: Within functional limits for tasks assessed     SENSATION: Patient denies any sensory changes.  MUSCLE LENGTH: Hamstrings: Right 75 deg; Left 75 deg Thomas test: WNL B  POSTURE: increased lumbar lordosis, anterior pelvic tilt, and B knees locked and pigeon toed.  PALPATION: Mild TTP on R QL, BITB  LUMBAR ROM:   AROM eval  Flexion To toes, stretch  Extension 100%, P  Right lateral flexion 100%P  Left lateral flexion 100%  Right rotation 80%  Left rotation 80%   (Blank rows = not tested)  LOWER EXTREMITY ROM:   WNL BLE   LOWER EXTREMITY MMT:    MMT Right eval Left eval  Hip flexion 4- 4-  Hip extension 4- 4-  Hip abduction 4- 4  Hip adduction    Hip internal rotation    Hip external rotation    Knee flexion 4- 4-  Knee extension 4- 4-  Ankle dorsiflexion 4 4  Ankle plantarflexion    Ankle  inversion    Ankle eversion     (Blank rows = not tested)  LUMBAR SPECIAL TESTS:  Straight leg raise test: Negative, Slump test: Negative, Single leg stance test: Negative, and FABER test: Negative  FUNCTIONAL TESTS:  SLS 6 sec L, 5 sec R  GAIT: Distance walked: In clinic distances Assistive device utilized: None Level of assistance: Complete Independence Comments: Antalgic, loud heel strike B  TREATMENT DATE:  01/29/24 NuStep L3x86min Pball rollouts x8 Pball side to side rolls x8  Supine Marches 2x20 Supine trunk rotations 2x8 Standing Hip Extension 1x8 each leg  HEP  01/15/24                                                                                                                              Education  PATIENT EDUCATION:  Education details: POC Person educated: Patient Education method: Explanation Education comprehension: verbalized understanding  HOME EXERCISE PROGRAM: Access Code: [email or web address] URL: [email or web address] Date: 01/29/2024 Prepared by: Almetta Fam  Exercises  - Supine March  - 1 x daily - 7 x weekly - 2 sets - 20 reps -  Standing Hip Extension with Counter Support  - 1 x daily - 7 x weekly - 2 sets - 10 reps -  Standing Hip Abduction with Counter Support  - 1 x daily - 7 x weekly - 2 sets - 10 reps -  Supine Lower Trunk Rotation  - 1 x daily - 7 x weekly - 2 sets - 10 reps   ASSESSMENT:  CLINICAL IMPRESSION: Patient is a  38 y.o. who was seen today for physical therapy treatment for chronic LBP, with a recent exacerbation 3 months ago which was severe. She reports B sciatic symptoms. The session was abbreviated today due late arrival. Pt tolerated treatments well, we should be able to progress her program next time. The focus was more on stretching than strengthening today to see what helps her back pain and what she could tolerate. She enjoyed the stretches and noted no pain throughout the entire session, she just notices the spots where  the pain is after some of the exercises. Pt was given an initial home program to work on at home and educated on importance of consistency with back pain. Pt will benefit from skilled PT to progress strengthening program and alleviate back pain for ease of ADLs.   OBJECTIVE IMPAIRMENTS: Abnormal gait, decreased activity tolerance, decreased balance, decreased coordination, decreased endurance, difficulty walking, decreased ROM, decreased strength, increased muscle spasms, impaired flexibility, postural dysfunction, and pain.   ACTIVITY LIMITATIONS: carrying, lifting, bending, squatting, and locomotion level  PARTICIPATION LIMITATIONS: meal prep, cleaning, laundry, shopping, community activity, and occupation  PERSONAL FACTORS: Past/current experiences are also affecting patient's functional outcome.   REHAB POTENTIAL: Good  CLINICAL DECISION MAKING: Stable/uncomplicated  EVALUATION COMPLEXITY: Low   GOALS: Goals reviewed with patient? Yes  SHORT TERM GOALS: Target date: 02/02/24  I with initial HEP Baseline: Goal status: INITIAL  LONG TERM GOALS: Target date: 03/25/24  I with final HEP Baseline:  Goal status: INITIAL  2.  Decrease Owestry score to < 21% for minimal impairment Baseline:  Goal status: INITIAL  3.  Patient will report at least 50% improvement in her back pain Baseline:  Goal status: INITIAL  3.  Patient will report the ability to complete her daily activities, including work, with back pain < 3/10 Baseline:  Goal status: INITIAL  4.  Patient will demonstrate proper body mechanics while lifting from low surfaces, turning and reaching while holding objects of about 5# Baseline:  Goal status: INITIAL  5.  Increase her LE strength to 4+/5 Baseline:  Goal status: INITIAL  PLAN:  PT FREQUENCY: 1-2x/week  PT DURATION: 10 weeks  PLANNED INTERVENTIONS: 97110-Therapeutic exercises, 97530- Therapeutic activity, [id number]- Neuromuscular re-education, 97535- Self  Care, 02859- Manual therapy, [id number]- Gait training, 97014- Electrical stimulation (unattended), [id number]- Traction (mechanical), [id number]- Ionotophoresis 4mg /ml Dexamethasone , Patient/Family education, Taping, Dry Needling, Joint mobilization, Spinal mobilization, Cryotherapy, and Moist heat.  PLAN FOR NEXT SESSION: HEP   Korby Ratay, NSCA-CPT, SPT 01/29/2024, 11:06 AM

## 2024-02-02 ENCOUNTER — Ambulatory Visit: Payer: Commercial Managed Care - PPO | Admitting: Physical Therapy

## 2024-02-12 ENCOUNTER — Ambulatory Visit: Payer: Commercial Managed Care - PPO

## 2024-02-16 ENCOUNTER — Ambulatory Visit: Payer: Commercial Managed Care - PPO | Admitting: Physical Therapy

## 2024-02-26 ENCOUNTER — Encounter: Payer: Self-pay | Admitting: Family

## 2024-02-26 ENCOUNTER — Ambulatory Visit: Payer: Commercial Managed Care - PPO | Admitting: Family

## 2024-02-26 VITALS — BP 132/82 | HR 87 | Temp 98.3°F | Ht 63.0 in | Wt 212.2 lb

## 2024-02-26 DIAGNOSIS — F333 Major depressive disorder, recurrent, severe with psychotic symptoms: Secondary | ICD-10-CM

## 2024-02-26 DIAGNOSIS — E669 Obesity, unspecified: Secondary | ICD-10-CM

## 2024-02-26 DIAGNOSIS — Z6837 Body mass index (BMI) 37.0-37.9, adult: Secondary | ICD-10-CM

## 2024-02-26 DIAGNOSIS — F322 Major depressive disorder, single episode, severe without psychotic features: Secondary | ICD-10-CM | POA: Insufficient documentation

## 2024-02-26 DIAGNOSIS — G479 Sleep disorder, unspecified: Secondary | ICD-10-CM

## 2024-02-26 MED ORDER — HYDROXYZINE HCL 10 MG PO TABS: 10.0000 mg | ORAL_TABLET | Freq: Two times a day (BID) | ORAL | 2 refills | Status: DC | PRN

## 2024-02-26 MED ORDER — VORTIOXETINE HBR 5 MG PO TABS: 5.0000 mg | ORAL_TABLET | Freq: Every day | ORAL | 2 refills | Status: DC

## 2024-02-26 NOTE — Patient Instructions (Signed)
 Welcome to [facility], It was a pleasure meeting you today!    As discussed, I have sent in Trintellix to your pharmacy. Start the samples with 1/2 pill for 1-2 weeks, then increase to 1 full pill daily. Take in the morning.   I have sent a referral to our therapy team for counseling, and I'm asking our referral nurse to help ensure you can get an appointment.   You can still call their office to schedule your first appointment. Start with  [facility] at [phone] or [facility] mental health at [phone]. IF YOU ARE FEELING SUICIDAL, call [phone], local mental health emergency number!  Please schedule a 3 week follow up visit today, ok if virtual.    PLEASE NOTE: If you had any LAB tests please let us know if you have not heard back within a few days. You may see your results on MyChart before we have a chance to review them but we will give you a call once they are reviewed by [place]. If we ordered any REFERRALS today, please let us know if you have not heard from their office within the next week.  Let us know through MyChart if you are needing REFILLS, or have your pharmacy send [place] the request. You can also use MyChart to communicate with me or any office staff.  Please try these tips to maintain a healthy lifestyle: It is important that you exercise regularly at least 30 minutes 5 times a week. Think about what you will eat, plan ahead. Choose whole foods, & think  "clean, green, fresh or frozen" over canned, processed or packaged foods which are more sugary, salty, and fatty. 70 to 75% of food eaten should be fresh vegetables and protein. 2-3  meals daily with healthy snacks between meals, but must be whole fruit, protein or vegetables. Aim to eat over a 10 hour period when you are active, for example, 7am to 5pm, and then STOP after your last meal of the day, drinking only water.  Shorter eating windows, 6-8 hours, are showing benefits in  heart disease and blood sugar regulation. Drink water every day! Shoot for 64 ounces daily = 8 cups, no other drink is as healthy! Fruit juice is best enjoyed in a healthy way, by EATING the fruit.

## 2024-02-26 NOTE — Progress Notes (Signed)
 New Patient Office Visit  Subjective:  Patient ID: EASTON SIVERTSON, female    DOB: August 01, 1986  Age: 38 y.o. MRN: [id number]  CC:  Chief Complaint  Patient presents with   new pt    Pt new estb care for new pcp. Stated depressed due losing job a month ago she was already having a time with depression but it has gotten worse she states. Also would like to see if could help her with her angry problem she has.    HPI Teresa Peterson presents for establishing care today.  Discussed the use of AI scribe software for clinical note transcription with the patient, who gave verbal consent to proceed.  History of Present Illness   The patient, with a history of depression, presents to establish care. She reports worsening depressive symptoms following recent job loss. She reports struggling with depression for a long time. She has previously been on Wellbutrin but reports it did not work for her & has not been on it for about a year and a half. She also reports insomnia and hearing voices when her stress levels are high. She has tried to seek help from a psychiatrist and therapist, but has not been successful in getting a response. She has also been dealing with issues related to her insurance, which has added to her stress.     Assessment & Plan:     Depression - History of depression with recent exacerbation due to job loss and associated stressors. Patient reports previous use of Wellbutrin with no benefit. Patient also reports auditory hallucinations during periods of high stress. Family history of successful treatment with Trintellix. -Start Trintellix 5mg  daily, titrate to 10mg  daily after 1 week if tolerated, samples for 2 weeks provided.  -Pharmacy team to seek approval for Trintellix from insurance. -Refer to mental health services, marked as urgent, numbers provided to call again.  -Provided patient with mental health emergency number [phone]) if feeling suicidal. -Follow up in 1  month, virtual ok.  Insomnia - Patient reports difficulty sleeping, possibly related to depression. -Prescribe Hydroxyzine 10-30mg  as needed for anxiety and insomnia, reminded pt on use & SE, has taken in the past. -F/U in 1 mo      Subjective:    Outpatient Medications Prior to Visit  Medication Sig Dispense Refill   albuterol (VENTOLIN HFA) 108 (90 Base) MCG/ACT inhaler Inhale 1-2 puffs into the lungs every 6 (six) hours as needed for wheezing or shortness of breath. 1 each 0   albuterol (VENTOLIN HFA) 108 (90 Base) MCG/ACT inhaler Inhale 1 puff into the lungs every 6 (six) hours as needed.     cyclobenzaprine (FLEXERIL) 5 MG tablet Take 1 tablet (5 mg total) by mouth at bedtime. (Patient not taking: Reported on 02/26/2024) 30 tablet 0   fluticasone (FLONASE) 50 MCG/ACT nasal spray Place 1 spray into both nostrils 2 (two) times daily. (Patient not taking: Reported on 02/26/2024) 16 g 2   hydrOXYzine (ATARAX) 25 MG tablet Take 25 mg by mouth 3 (three) times daily as needed. (Patient not taking: Reported on 02/26/2024)     meloxicam (MOBIC) 7.5 MG tablet Take 1 tablet (7.5 mg total) by mouth daily. With food (Patient not taking: Reported on 02/26/2024) 30 tablet 0   predniSONE (DELTASONE) 10 MG tablet Take 2 tablets (20 mg total) by mouth daily. (Patient not taking: Reported on 02/26/2024) 15 tablet 0   triamcinolone (NASACORT) 55 MCG/ACT AERO nasal inhaler USE 1 SPRAY(S) IN EACH NOSTRIL  AS DIRECTED AS NEEDED (Patient not taking: Reported on 02/26/2024)     valACYclovir (VALTREX) 1000 MG tablet Take 1,000 mg by mouth 2 (two) times daily. (Patient not taking: Reported on 02/26/2024)     No facility-administered medications prior to visit.   Past Medical History:  Diagnosis Date   Allergy    Anemia 1986/02[birth year]   Born anemic, grew out of it   Asthma    Depression    Emphysema of lung (HCC)    GERD (gastroesophageal reflux disease)    Headache(784.0)    Migraines w/Aura   Heart murmur    HSV infection     Hx of vaginal herpes   Noncompliance with medications    Right shoulder tendonitis 03/22/2022   STD (sexually transmitted disease) 02/11/2022   Tx'd for chlamydia x3   Tinea versicolor 11/08/2011   History reviewed. No pertinent surgical history.  Objective:   Today's Vitals: BP 132/82   Pulse 87   Temp 98.3 F (36.8 C) (Temporal)   Ht 5\' 3"  (1.6 m)   Wt 212 lb 3.2 oz (96.3 kg)   LMP 01/25/2024 (Approximate)   SpO2 98%   BMI 37.59 kg/m   Physical Exam Vitals and nursing note reviewed.  Constitutional:      Appearance: Normal appearance.  Cardiovascular:     Rate and Rhythm: Normal rate and regular rhythm.  Pulmonary:     Effort: Pulmonary effort is normal.     Breath sounds: Normal breath sounds.  Musculoskeletal:        General: Normal range of motion.  Skin:    General: Skin is warm and dry.  Neurological:     Mental Status: She is alert.  Psychiatric:        Mood and Affect: Mood normal.        Behavior: Behavior normal.    Meds ordered this encounter  Medications   hydrOXYzine (ATARAX) 10 MG tablet    Sig: Take 1 tablet (10 mg total) by mouth 2 (two) times daily as needed for anxiety (Can take 2-3 pills for sleep if needed.).    Dispense:  60 tablet    Refill:  2    Supervising Provider:   ANDY, CAMILLE L [[id number]]   vortioxetine HBr (TRINTELLIX) 5 MG TABS tablet    Sig: Take 1 tablet (5 mg total) by mouth daily.    Dispense:  30 tablet    Refill:  2    Supervising Provider:   ANDY, CAMILLE L [[id number]]    Dulce Sellar, NP

## 2024-02-26 NOTE — Assessment & Plan Note (Signed)
 History of depression with recent exacerbation due to job loss and associated stressors. Patient reports previous use of Wellbutrin with no benefit. Patient also reports auditory hallucinations during periods of high stress. Family history of successful treatment with Trintellix. -Start Trintellix 5mg  daily, titrate to 10mg  daily after 1 week if tolerated, samples for 2 weeks provided.  -Pharmacy team to seek approval for Trintellix from insurance. -Refer to mental health services, marked as urgent, numbers provided to call again.  -Provided patient with mental health emergency number ([phone]) if feeling suicidal. -Follow up in 1 month, virtual ok.

## 2024-02-26 NOTE — Assessment & Plan Note (Signed)
 Patient reports difficulty sleeping, possibly related to depression. -Prescribe Hydroxyzine 10-30mg  as needed for anxiety and insomnia, reminded pt on use & SE, has taken in the past. -F/U in 1 mo

## 2024-03-04 ENCOUNTER — Other Ambulatory Visit (HOSPITAL_COMMUNITY): Payer: Self-pay

## 2024-03-04 ENCOUNTER — Telehealth: Payer: Self-pay

## 2024-03-04 DIAGNOSIS — F333 Major depressive disorder, recurrent, severe with psychotic symptoms: Secondary | ICD-10-CM

## 2024-03-04 NOTE — Telephone Encounter (Signed)
 Pharmacy Patient Advocate Encounter   Received notification from CoverMyMeds that prior authorization for Trintellix (formerly Brintellix) 5MG  tablets is required/requested.   Insurance verification completed.   The patient is insured through [facility] .   Per test claim: PA required; PA submitted to above mentioned insurance via CoverMyMeds Key/confirmation #/EOC [id number] Status is pending. SENT TO PLAN

## 2024-03-10 NOTE — Telephone Encounter (Signed)
 Pharmacy Patient Advocate Encounter  Received notification from [facility] that Prior Authorization for TRINTELLIX has been DENIED.  Full denial letter will be uploaded to the media tab. See denial reason below. THE REQUEST DOES NOT MEET THE NCMD PREFERRED DRUG LIST. SEE LETTER FOR PREFERRED DRUG LIST   PA #/Case ID/Reference #: [id number]

## 2024-03-15 MED ORDER — VENLAFAXINE HCL ER 37.5 MG PO CP24
37.5000 mg | ORAL_CAPSULE | Freq: Every day | ORAL | 2 refills | Status: DC
Start: 2024-03-15 — End: 2024-06-23

## 2024-03-15 NOTE — Addendum Note (Signed)
 Addended byDulce Sellar on: 03/15/2024 11:00 AM   Modules accepted: Orders

## 2024-03-15 NOTE — Telephone Encounter (Signed)
 Tried to call pt one number work and one did not could not leave message.

## 2024-03-15 NOTE — Telephone Encounter (Signed)
 Let pt know unfortunately, her ins will not cover the trintellix, they require her to try another medication first, so I have sent in generic Effexor, take every morning, headache possible, take tylenol and it should go away after a week, slight worsening of depression possible also usually goes away but call back if any concerns. She needs to move out her 1 month follow up appointment by a couple of weeks, thanks

## 2024-03-17 NOTE — Telephone Encounter (Signed)
 I called pt, lvm in regards to PCP's message below and denial. Letter is being mailed to pt. If pt does return call, 03/18/2024 OV needs to moved out for 1 month.

## 2024-03-18 ENCOUNTER — Telehealth: Admitting: Family

## 2024-04-15 ENCOUNTER — Encounter: Payer: Commercial Managed Care - PPO | Admitting: Nurse Practitioner

## 2024-04-16 ENCOUNTER — Encounter (HOSPITAL_COMMUNITY): Payer: Self-pay
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
## 2024-04-21 ENCOUNTER — Ambulatory Visit (HOSPITAL_COMMUNITY): Payer: Self-pay | Admitting: Psychiatry

## 2024-04-27 ENCOUNTER — Ambulatory Visit (HOSPITAL_COMMUNITY): Payer: Self-pay | Admitting: Clinical

## 2024-04-27 ENCOUNTER — Encounter (HOSPITAL_COMMUNITY): Payer: Self-pay

## 2024-05-05 ENCOUNTER — Ambulatory Visit
Admission: EM | Admit: 2024-05-05 | Discharge: 2024-05-05 | Disposition: A | Discharge status: Home / Self Care | Attending: Family Medicine | Admitting: Family Medicine

## 2024-05-05 ENCOUNTER — Ambulatory Visit (HOSPITAL_COMMUNITY)
Admission: EM | Admit: 2024-05-05 | Discharge: 2024-05-05 | Disposition: A | Discharge status: Home / Self Care | Attending: Psychiatry | Admitting: Psychiatry

## 2024-05-05 DIAGNOSIS — F32A Depression, unspecified: Secondary | ICD-10-CM | POA: Insufficient documentation

## 2024-05-05 DIAGNOSIS — K529 Noninfective gastroenteritis and colitis, unspecified: Secondary | ICD-10-CM | POA: Insufficient documentation

## 2024-05-05 DIAGNOSIS — T1491XA Suicide attempt, initial encounter: Secondary | ICD-10-CM | POA: Insufficient documentation

## 2024-05-05 DIAGNOSIS — E860 Dehydration: Secondary | ICD-10-CM | POA: Diagnosis present

## 2024-05-05 DIAGNOSIS — Z560 Unemployment, unspecified: Secondary | ICD-10-CM | POA: Insufficient documentation

## 2024-05-05 DIAGNOSIS — Z9151 Personal history of suicidal behavior: Secondary | ICD-10-CM | POA: Insufficient documentation

## 2024-05-05 DIAGNOSIS — F1220 Cannabis dependence, uncomplicated: Secondary | ICD-10-CM | POA: Insufficient documentation

## 2024-05-05 DIAGNOSIS — R300 Dysuria: Secondary | ICD-10-CM

## 2024-05-05 DIAGNOSIS — F314 Bipolar disorder, current episode depressed, severe, without psychotic features: Secondary | ICD-10-CM | POA: Insufficient documentation

## 2024-05-05 LAB — POCT URINALYSIS DIP (MANUAL ENTRY)
Glucose, UA: NEGATIVE mg/dL
Leukocytes, UA: NEGATIVE
Nitrite, UA: NEGATIVE
Protein Ur, POC: >=300 mg/dL — AB
Spec Grav, UA: >=1.03 — AB (ref 1.010–1.025)
Urobilinogen, UA: 1 U/dL
pH, UA: 6 (ref 5.0–8.0)

## 2024-05-05 LAB — POCT URINE PREGNANCY: Preg Test, Ur: NEGATIVE

## 2024-05-05 MED ORDER — SODIUM CHLORIDE 0.9 % IV BOLUS
1000.0000 mL | Freq: Once | INTRAVENOUS | Status: AC
  Administered 2024-05-05: 1000 mL via INTRAVENOUS

## 2024-05-05 MED ORDER — LOPERAMIDE HCL 2 MG PO CAPS: 2.0000 mg | ORAL_CAPSULE | Freq: Two times a day (BID) | ORAL | 0 refills | Status: DC | PRN

## 2024-05-05 MED ORDER — ONDANSETRON HCL 4 MG/2ML IJ SOLN
4.0000 mg | Freq: Once | INTRAMUSCULAR | Status: AC
  Administered 2024-05-05: 4 mg via INTRAVENOUS

## 2024-05-05 MED ORDER — ONDANSETRON 8 MG PO TBDP: 8.0000 mg | ORAL_TABLET | Freq: Three times a day (TID) | ORAL | 0 refills | Status: DC | PRN
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
## 2024-05-05 NOTE — ED Triage Notes (Addendum)
 Pt reports she has n/v, sweats, ears felt hot, dry heaving, low back pain and low right side abdominal pain that radiates to her back x 1 day    Pt states she has ovary pain. States she also wants STD testing as she is having burning with urination. States she took two preg test and were neg

## 2024-05-05 NOTE — ED Provider Notes (Addendum)
 [facility] - URGENT CARE CENTER  Note:  This document was prepared using Conservation officer, historic buildings and may include unintentional dictation errors.  MRN: [id number] DOB: [birth year]  Subjective:   Teresa Peterson is a 38 y.o. female presenting for 1 day history of sweats, nausea, vomiting, subjective fever, low back pain and right-sided lower abdominal pain, pelvic pain.  Patient has also had persistent diarrhea.  She would like to be checked for sexually transmitted infections as she is having dysuria and the above mentioned pains.  She has taken 2 pregnancy tests and both were negative.  No respiratory symptoms.   Through the triage process, patient was found to have significant suicidal thoughts, depression.  During my time with her, patient endorsed longstanding history of major depression, suicide attempts since she was 38 years old.  She has not gotten consistent mental health care.  She reports that 2 weeks ago she attempted suicide by swallowing an entire pill of Tylenol  while she was at home.  She reports that shortly thereafter she became very nauseous and threw up the entirety of what she swallowed.  She has not since attempted suicide again.  She continues to have thoughts of depression, passive suicidal thoughts.  Has not had any significant thoughts of a suicidal plan.  Patient does report that she was supposed to establish care with a mental health provider but missed her appointment at the end of April.  She has not rescheduled.  She was also provided with a sample of Trintellix  which she did not start at all.  This was from a visit with her family care provider February 26, 2024.  Patient reports that primarily her current stress and depression comes from having lost her job in February.  Reports that since then she has felt a lack of support from anyone and this together with not being able to provide for so financially exacerbated her depression.  In the past 2 weeks since  her last suicide attempt, patient reports that she feels slightly more hopeful as she wants to prioritize her nieces and visiting with her family.  She still lives alone but would like to actively seek mental health care.  No current facility-administered medications for this encounter.  Current Outpatient Medications:    albuterol  (VENTOLIN  HFA) 108 (90 Base) MCG/ACT inhaler, Inhale 1-2 puffs into the lungs every 6 (six) hours as needed for wheezing or shortness of breath., Disp: 1 each, Rfl: 0   cyclobenzaprine  (FLEXERIL ) 5 MG tablet, Take 1 tablet (5 mg total) by mouth at bedtime. (Patient not taking: Reported on 02/26/2024), Disp: 30 tablet, Rfl: 0   hydrOXYzine  (ATARAX ) 10 MG tablet, Take 1 tablet (10 mg total) by mouth 2 (two) times daily as needed for anxiety (Can take 2-3 pills for sleep if needed.)., Disp: 60 tablet, Rfl: 2   venlafaxine  XR (EFFEXOR  XR) 37.5 MG 24 hr capsule, Take 1 capsule (37.5 mg total) by mouth daily with breakfast., Disp: 30 capsule, Rfl: 2   Allergies  Allergen Reactions   Peanut Oil Anaphylaxis    peanuts   Pseudoephedrine Hcl Itching and Shortness Of Breath   Sudafed [Pseudoephedrine Hcl] Itching    Past Medical History:  Diagnosis Date   Allergy    Anemia 1988/05[birth year]27   Born anemic, grew out of it   Asthma    Depression    Emphysema of lung (HCC)    GERD (gastroesophageal reflux disease)    Headache(784.0)    Migraines w/Aura   Heart  murmur    HSV infection    Hx of vaginal herpes   Noncompliance with medications    Right shoulder tendonitis 03/22/2022   STD (sexually transmitted disease) 02/11/2022   Tx'd for chlamydia x3   Tinea versicolor 11/08/2011     History reviewed. No pertinent surgical history.  Family History  Problem Relation Age of Onset   Asthma Mother    Diabetes Mother    Asthma Brother    Asthma Maternal Aunt    Diabetes Maternal Aunt    Hypertension Maternal Aunt    Asthma Maternal Grandmother    Stroke Maternal  Grandmother    Diabetes Maternal Grandfather    Kidney disease Maternal Grandfather    Asthma Paternal Grandmother    Alcohol abuse Other        grandparents   Arthritis Other        grandparent   Cancer Other        relative   Stroke Other        grandparent   Kidney disease Other        grandparents   Diabetes Other        grandparent    Social History   Tobacco Use   Smoking status: Some Days    Types: Cigars   Smokeless tobacco: Never   Tobacco comments:    I went cold [place] in 2012 for 3 years. 2015-2022 tired to stop countless times.  Vaping Use   Vaping status: Never Used  Substance Use Topics   Alcohol use: Not Currently    Comment: 1 bottle of wine/month   Drug use: Yes    Frequency: 3.0 times per week    Types: Marijuana    ROS   Objective:   Vitals: BP (!) 139/95 (BP Location: Right Arm)   Pulse 93   Temp 98.7 F (37.1 C) (Oral)   Resp 20   LMP 04/28/2024 Comment: start date  SpO2 96%   Physical Exam Constitutional:      General: She is not in acute distress.    Appearance: Normal appearance. She is well-developed. She is not ill-appearing, toxic-appearing or diaphoretic.  HENT:     Head: Normocephalic and atraumatic.     Nose: Nose normal.     Mouth/Throat:     Mouth: Mucous membranes are dry.     Pharynx: No pharyngeal swelling, oropharyngeal exudate, posterior oropharyngeal erythema or uvula swelling.     Tonsils: No tonsillar exudate or tonsillar abscesses. 0 on the right. 0 on the left.  Eyes:     General: No scleral icterus.       Right eye: No discharge.        Left eye: No discharge.     Extraocular Movements: Extraocular movements intact.     Conjunctiva/sclera: Conjunctivae normal.  Cardiovascular:     Rate and Rhythm: Normal rate.  Pulmonary:     Effort: Pulmonary effort is normal.  Abdominal:     General: Bowel sounds are normal. There is no distension.     Palpations: Abdomen is soft. There is no mass.     Tenderness:  There is abdominal tenderness in the right lower quadrant and suprapubic area. There is no right CVA tenderness, left CVA tenderness, guarding or rebound.  Skin:    General: Skin is warm and dry.  Neurological:     General: No focal deficit present.     Mental Status: She is alert and oriented to person, place, and time.  Cranial Nerves: No cranial nerve deficit.     Motor: No weakness.     Coordination: Coordination normal.     Gait: Gait normal.  Psychiatric:        Attention and Perception: She is attentive. She does not perceive auditory or visual hallucinations.        Mood and Affect: Mood is depressed. Mood is not anxious or elated. Affect is flat. Affect is not labile, blunt, angry, tearful or inappropriate.        Speech: She is communicative. Speech is not rapid and pressured, delayed, slurred or tangential.        Behavior: Behavior is not agitated, slowed, aggressive, withdrawn, hyperactive or combative.        Thought Content: Thought content is not paranoid or delusional. Thought content includes suicidal ideation. Thought content does not include homicidal ideation. Thought content does not include homicidal or suicidal plan.        Cognition and Memory: Cognition is not impaired. Memory is not impaired. She does not exhibit impaired recent memory or impaired remote memory.        Judgment: Judgment is not impulsive or inappropriate.     Results for orders placed or performed during the hospital encounter of 05/05/24 (from the past 24 hours)  POCT urinalysis dipstick     Status: Abnormal   Collection Time: 05/05/24 11:34 AM  Result Value Ref Range   Color, UA yellow yellow   Clarity, UA hazy (A) clear   Glucose, UA negative negative mg/dL   Bilirubin, UA small (A) negative   Ketones, POC UA small (15) (A) negative mg/dL   Spec Grav, UA >=6.387 (A) 1.010 - 1.025   Blood, UA trace-intact (A) negative   pH, UA 6.0 5.0 - 8.0   Protein Ur, POC >=300 (A) negative mg/dL    Urobilinogen, UA 1.0 0.2 or 1.0 E.U./dL   Nitrite, UA Negative Negative   Leukocytes, UA Negative Negative   A 1000 cc fluid bolus of normal saline was administered IV over timeframe of 55 minutes.  A 4 mg dose of ondansetron  was administered IV.  Assessment and Plan :   PDMP not reviewed this encounter.  1. Attempted suicide (HCC)   2. Dysuria   3. Dehydration   4. Gastroenteritis   5. Depression, unspecified depression type    Patient presented for sick visit with GI symptoms, abdominal pain as listed above.  In clinic management as above.  Recommend supportive care for viral gastroenteritis.  Patient incidentally also has severe depression with high risk for suicide.  I discussed this with patient at length and initially patient consistently refused to present to the behavioral health emergency center.  At discharge, my final conversation with the patient resulted in her agreement to present to the behavioral health emergency center now.  She refused transport by EMS.  Unfortunately cannot go by police escort due to the illness involved.  I attempted to contact the [facility] for continuity of care and case report.  After 4 phone call attempts, I was finally able to speak with Houda Saridi.  She refused case report x2 and recommended I redirect the patient to their clinic now.  Patient contracts for safety and plans on presenting there by personal vehicle.  Case discussed in detail with my supervising physician, Dr. Nicholette Barley.     Adolph Hoop, PA-C 05/05/24 1407

## 2024-05-05 NOTE — ED Provider Notes (Cosign Needed Addendum)
 Behavioral Health Urgent Care Medical Screening Exam  Patient Name: Teresa Peterson MRN: [id number] Date of Evaluation: 05/05/24 Chief Complaint:  worsening depressive symptoms  Diagnosis:  Final diagnoses:  Delta-9-tetrahydrocannabinol (THC) dependence (HCC)  Bipolar 1 disorder, depressed, severe (HCC)   History of Present illness: Teresa Peterson is a 38 y.o. female  with a reported prior mental health diagnoses of MDD & ADD who presents to the [facility] today after initially presenting to the [facility] medical urgent care center on [address] for worsening depressive symptoms. She was asked to present to this location by the provider at the urgent care center.  Assessment: On assessment, pt presents with a depressed mood, but brightens up and engages in conversations and laughs occasionally during encounter. Pt is seen during this encounter along with [facility] Counselor.  Pt recounts worsening depressive symptoms; states that depression started when she was much younger, reports an overdose on medications at age 70 yrs old, another overdose at age 89, then 38 yrs old, and another in 2019 after moving from [place], [place]. States that she took half a bottle of Tylenol  at that time. She reports seeing a therapist at age 70 yrs old, but states that she has not had one recently because she had one, and could not make the appt on 04/26/24.   Pt describes mood fluctuations which have been ongoing for >20 yrs; describes periods of mania preceding depressive episodes, with mania lasting 1-2 weeks; descries mania as consisting of periods when she gets hypersexual, is overly energetic, does not feel the need for sleep, but is waking up the following day, still feeling very energetic. Symptoms described by patient are mostly consistent with a diagnosis of bipolar d/o.    Pt reports auditory hallucinations which consist of whispers of multiple voices coming from inside her head, states that the voices used to be  commanding when she was on Wellbutrin  and once commanded her to kill her father, but this has not been the case lately. She reports visual hallucinations of shadows with the last ones >8 months ago.Denies first rank symptoms. Reports paranoia which is currently lingering, states that this is always there. Psychosis seems to be substance induced as pt reports that it is worse after she is coming off THC. States THC numbs the voices, but when it clears, voices are worse. Reports that the [facility] is worse when she is "stressed".   Reports depressive symptoms including insomnia, anhedonia, isolating, decreased energy levels and mostly staying in bed, poor concentration levels, poor appetite where she eats mostly snacks.  Currently denies SI/HI, denies intent or plan to harm self, is verbally contracting for safety outside of the hospital, denies use of other substances, denies ETOH use. States she uses THC to numb how she feels (to self medicate her depressive symptoms, anxiety, and psychosis). Reports a strong family history of mental health problems; multiple personality in brother bipolar d/o in same brother, states that mother takes Trintellix  for MDD. Reports her protective factors as being her 9yo and 1yo nieces, states her brother moved so he can be closer to her.   Pt is not currently on medications at this time, but she is agreeable to a CDIOP in person as follows: Teresa Peterson CD-IOP Program You have an appointment on Wednesday, May 21 st at 9am at [address] in [place]    Specialized Group Therapy for Substance Abuse If you have a substance abuse disorder (with or without a mental health condition), you may  benefit from specialized substance abuse therapy in a group setting. According to discharge survey data, 70 percent of patients completing our chemical dependency intensive outpatient program report fewer symptoms of substance abuse and incidents of relapse.  Pt, educated on the fact  that this will include medication management and therapy and is agreeable. Discharged with plan as above.   Minimal at this time: No identifiable suicidal ideation.  Patients presenting with no risk factors but with morbid ruminations; may be classified as minimal risk based on the severity of the depressive symptoms   Flowsheet Row ED from 05/05/2024 in [facility] Most recent reading at 05/05/2024  2:23 PM UC from 05/05/2024 in [facility] at [facility]) Most recent reading at 05/05/2024 11:21 AM ED from 01/24/2024 in [facility] at [facility] Most recent reading at 01/24/2024 12:50 AM  C-SSRS RISK CATEGORY Error: Q7 should not be populated when Q6 is No High Risk No Risk       Psychiatric Specialty Exam  Presentation  General Appearance:Fairly Groomed  Eye Contact:Fair  Speech:Clear and Coherent  Speech Volume:Normal  Handedness:Right   Mood and Affect  Mood:Anxious; Depressed  Affect:Congruent   Thought Process  Thought Processes:Coherent  Descriptions of Associations:Intact  Orientation:Full (Time, Place and Person)  Thought Content:Logical  Diagnosis of Schizophrenia or Schizoaffective disorder in past: No  Duration of Psychotic Symptoms: Greater than six months  Hallucinations:None  Ideas of Reference:None  Suicidal Thoughts:No  Homicidal Thoughts:No   Sensorium  Memory:Immediate Fair  Judgment:Fair  Insight:Fair   Executive Functions  Concentration:Fair  Attention Span:Fair  Recall:Fair  Fund of Knowledge:Fair  Language:Fair   Psychomotor Activity  Psychomotor Activity:Normal   Assets  Assets:Communication Skills; Resilience   Sleep  Sleep:Fair  Number of hours: No data recorded  Physical Exam: Physical Exam Vitals and nursing note reviewed.  Neurological:     Mental Status: She is oriented to person, place, and time.  Psychiatric:         Mood and Affect: Mood normal.        Behavior: Behavior normal.        Thought Content: Thought content normal.        Judgment: Judgment normal.    Review of Systems  Psychiatric/Behavioral:  Positive for depression and substance abuse. Negative for hallucinations, memory loss and suicidal ideas. The patient is nervous/anxious and has insomnia.   All other systems reviewed and are negative.  Blood pressure (!) 145/79, pulse 94, resp. rate 19, last menstrual period 04/28/2024, SpO2 99%. There is no height or weight on file to calculate BMI.  Musculoskeletal: Strength & Muscle Tone: within normal limits Gait & Station: normal Patient leans: N/A   [facility] MSE Discharge Disposition for Follow up and Recommendations: Based on my evaluation the patient does not appear to have an emergency medical condition and can be discharged with resources and follow up care in outpatient services for Substance Abuse Intensive Outpatient Program and Group Therapy  CDIOP scheduled as above.  Robet Chiquito, NP 05/05/2024, 6:20 PM

## 2024-05-05 NOTE — Discharge Instructions (Addendum)
..   Base on the information you have provided and the presenting issue, outpatient services and resources for have been recommended.  It is imperative that you follow through with treatment recommendations within 5-7 days from the of discharge to mitigate further risk to your safety and mental well-being. A list of referrals has been provided below to get you started.  You are not limited to the list provided.  In case of an urgent crisis, you may contact the Mobile Crisis Unit with [facility] at [phone].                      Arlin Benes CD-IOP Program You have an appointment on Wednesday, May 21 st at 9am at [address] in [place]    Specialized Group Therapy for Substance Abuse If you have a substance abuse disorder (with or without a mental health condition), you may benefit from specialized substance abuse therapy in a group setting. According to discharge survey data, 70 percent of patients completing our chemical dependency intensive outpatient program report fewer symptoms of substance abuse and incidents of relapse.  Under the guidance of a licensed mental health professional, meet with your peers every Monday, Wednesday and Friday from 9 a.m. to 12 p.m. for 6 to 8 weeks to:  Learn about chemical dependency, mental illness and co-occurring disorders. Develop relapse-prevention skills. Set personalized goals with your treatment team. To build on the skills you gain, you can attend Alcoholics Anonymous or Narcotics Anonymous meetings in the evenings and access follow-up care through weekly group meetings with peers. Ongoing support promotes wellness and recovery.  For more information, call Melynda Stagger, LCSW at [phone]. We work directly with employers and families to ensure you receive the care you need.

## 2024-05-05 NOTE — Discharge Instructions (Addendum)
 I am concerned about your mental health including the risk for suicide.  Please go straight to the [facility]. The address is listed below.

## 2024-05-05 NOTE — BH Assessment (Addendum)
 Comprehensive Clinical Assessment (CCA) Note  05/05/2024 Teresa Peterson [id number]   Disposition: Per Teresa Chiquito, NP patient does not meet inpatient criteria.  Outpatient MH and SA treatment has been recommended. Patient has been referred to the [facility] CD-IOP program.   The patient demonstrates the following risk factors for suicide: Chronic risk factors for suicide include: psychiatric disorder of MDD, untreated, R/O Bipolar Disorder, substance use disorder, and previous suicide attempts x4, most recent 3 wks ago by OD(did not tell anyone or seek treatment). Acute risk factors for suicide include: unemployment, social withdrawal/isolation, and loss (financial, interpersonal, professional). Protective factors for this patient include: positive social support, responsibility to others (children, family), and hope for the future. Considering these factors, the overall suicide risk at this point appears to be low. Patient is appropriate for outpatient follow up.  Patient is a 38 year old female with a history of  who presents voluntarily to [facility] for assessment. Patient presents unaccompanied.  Patient presents today seeking "some kind of treatment."  She reports she has been struggling with her mood since she lost her job in February.  She states she was working for the [employer].  She is looking for work now, however struggling to find a job.  Part of this has to do with her depressive symptoms worsening, making it difficult to get out of the bed.  She states she stays in the bed a lot of the day.  She has applied for some jobs and she is considering connecting with a temporary agency.  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  Patient shares she has had some mood instability at times.  She states she has had intermittent episodes with elevated mood, including feeling energized on minimal sleep, impulsive and  hypersexual, followed by "low lows."  Patient also shares she has had auditory hallucinations, hearing whispers "a lot of the time."  She struggles to make out the content, and she has never had command hallucinations, outside of the period during which she is taking prescribed Wellbutrin .  She denies current auditory hallucinations.  She denies visual hallucinations, outside of sometimes seeing "shadows or dark figures."  Patient admits to using [facility] "heavily."  She reports she smokes 3-4 blunts daily and states she has been using almost daily since the age of 58.  It appears she may be self-medicating with THC, as she states THC quiets the voices.  Patient is open to treatment recommendations, stating she wants to get better.  Patient shares her brother has Bipolar Disorder and "multiple personality disorder" (DID), so she is curious as to whether she may have an untreated mental health condition. Patient reports she has been dealing with depression for approximately 20 years, with no psychiatric treatment since she was 38 years old.  She reports her depression began earlier, around age 44 she had her first suicide attempt by overdose.  She reports a history of 4 attempts, with the most recent 3 weeks ago by overdose.  Patient states she took a bottle of Midol , immediately began to feel ill before she vomited everything she ingested.  Patient is denying current SI, HI and AVH.  She identifies protective factors as her brother and his children.  She states she is very close to her brother's children.  Her brother and his girlfriend have been inviting patient over often, to "cheer me up."  Patient also reports that her uncle is in town,  and they have been inseparable for the past 1.5 weeks.  She reports feeling a bit sad he will be leaviing soon to return to [place] where he is currently serving.  Patient affirms her safety and is eager to start an outpatient treatment program soon.  She has been referred to the [facility]  CD-IOP program, and may be able to start as early as next week.    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 Self  What Is the Reason for Your Visit/Call Today? [name] is a 38 year old female presenting to [facility] unaccompanied. Pt reports that she swallowed a pill bottle of Mydol (3 weeks ago). Pt did not go to the ED to be evaluated. Pt was sent here from her urgent care to seek an evaluation. Pt endorses sucidal thoughts, but has no plan. Pt smokes marijuana daily. Pt is wanting a referral for OPT and a Psych Eval. Pt denies alcohol use, Hi and Avh.  How Long Has This Been Causing You Problems? 1 wk - 1 month  What Do You Feel Would Help You the Most Today? Medication(s)   Have You Recently Had Any Thoughts About Hurting Yourself? Yes  Are You Planning to Commit Suicide/Harm Yourself At This time? Yes   Flowsheet Row ED from 05/05/2024 in [facility] Most recent reading at 05/05/2024  2:23 PM UC from 05/05/2024 in [facility]) Most recent reading at 05/05/2024 11:21 AM ED from 01/24/2024 in [facility] Emergency Department at [facility] Most recent reading at 01/24/2024 12:50 AM  C-SSRS RISK CATEGORY Error: Q7 should not be populated when Q6 is No High Risk No Risk       Have you Recently Had Thoughts About Hurting Someone Teresa Peterson? No  Are You Planning to Harm Someone at This Time? No  Explanation: N/A  Have You Used Any Alcohol or Drugs in the Past 24 Hours? Yes How Long Ago Did You Use Drugs or Alcohol? Yesterday What Did You Use and How Much? 3-4 blunts worth (uses bowl)  Do You Currently Have a  Therapist/Psychiatrist? No  Name of Therapist/Psychiatrist:    Have You Been Recently Discharged From Any Office Practice or Programs? No  Explanation of Discharge From Practice/Program: N/A    CCA Screening Triage Referral Assessment Type of Contact: Face-to-Face  Telemedicine Service Delivery:   Is this Initial or Reassessment?   Date Telepsych consult ordered in [facility]:    Time Telepsych consult ordered in [facility]:    Location of Assessment: [facility]  Provider Location: [facility] Assessment Services   Collateral Involvement: None   Does Patient Have a Automotive engineer Guardian? No  Legal Guardian Contact Information: N/A  Copy of Legal Guardianship Form: -- (N/A)  Legal Guardian Notified of Arrival: -- (N/A)  Legal Guardian Notified of Pending Discharge: -- (N/A)  If Minor and Not Living with Parent(s), Who has Custody? N/A  Is CPS involved or ever been involved? Never  Is APS involved or ever been involved? Never  Patient Determined To Be At Risk for Harm To Self or Others Based on Review of Patient Reported Information or Presenting Complaint? Yes, for Self-Harm  Method: -- (N/A, no HI)  Availability of Means: -- (N/A, no HI)  Intent: -- (N/A, no HI)  Notification Required: -- (N/A, no HI)  Additional Information for Danger to Others Potential: -- (N/A, no HI)  Additional Comments for Danger to Others Potential: N/A, no HI  Are There Guns or Other Weapons in Your Home? No  Types of Guns/Weapons: N/A  Are These Weapons Safely Secured?                            -- (N/A)  Who Could Verify You Are Able To Have These Secured: N/A  Do You Have any Outstanding Charges, Pending Court Dates, Parole/Probation? Denies  Contacted To Inform of Risk of Harm To Self or Others: Family/Significant Other:    Does Patient Present under Involuntary Commitment? No    [place] of Residence: [place]   Patient Currently Receiving the Following  Services: Not Receiving Services   Determination of Need: Urgent (48 hours)   Options For Referral: Intensive Outpatient Therapy; Medication Management; Outpatient Therapy; Chemical Dependency Intensive Outpatient Therapy (CDIOP)     CCA Biopsychosocial Patient Reported Schizophrenia/Schizoaffective Diagnosis in Past: No   Strengths: Patient is seeking treatment. She has supportive family.   Mental Health Symptoms Depression:  Change in energy/activity; Difficulty Concentrating; Worthlessness; Hopelessness   Duration of Depressive symptoms: Duration of Depressive Symptoms: Greater than two weeks   Mania:  Racing thoughts; Increased Energy ("episodes" from time to time for past 20 yrs)   Anxiety:   Worrying; Tension   Psychosis:  Hallucinations (hears "whispers" but cannot make out content)   Duration of Psychotic symptoms: Duration of Psychotic Symptoms: Greater than six months   Trauma:  None   Obsessions:  None   Compulsions:  None   Inattention:  N/A   Hyperactivity/Impulsivity:  N/A   Oppositional/Defiant Behaviors:  N/A   Emotional Irregularity:  Mood lability; Chronic feelings of emptiness   Other Mood/Personality Symptoms:  NA    Mental Status Exam Appearance and self-care  Stature:  Average   Weight:  Overweight   Clothing:  Casual   Grooming:  Normal   Cosmetic use:  Age appropriate   Posture/gait:  Normal   Motor activity:  Not Remarkable   Sensorium  Attention:  Normal   Concentration:  Normal   Orientation:  X5   Recall/memory:  Normal   Affect and Mood  Affect:  Depressed   Mood:  Depressed   Relating  Eye contact:  Normal   Facial expression:  Responsive   Attitude toward examiner:  Cooperative   Thought and Language  Speech flow: Clear and Coherent   Thought content:  Appropriate to Mood and Circumstances   Preoccupation:  None   Hallucinations:  Auditory   Organization:  Therapist, nutritional  of Knowledge:  Average   Intelligence:  Average   Abstraction:  Normal   Judgement:  Impaired   Reality Testing:  Adequate   Insight:  Gaps   Decision Making:  Impulsive; Vacilates   Social Functioning  Social Maturity:  Isolates   Social Judgement:  Normal   Stress  Stressors:  Surveyor, quantity; Work; Transitions   Coping Ability:  Deficient supports; Overwhelmed   Skill Deficits:  Communication; Interpersonal   Supports:  Family  Religion: Religion/Spirituality Are You A Religious Person?: No How Might This Affect Treatment?: N/A  Leisure/Recreation: Leisure / Recreation Do You Have Hobbies?: No  Exercise/Diet: Exercise/Diet Do You Exercise?: No Have You Gained or Lost A Significant Amount of Weight in the Past Six Months?: No Do You Follow a Special Diet?: No Do You Have Any Trouble Sleeping?: Yes Explanation of Sleeping Difficulties: sleeps 2-3 hrs per night since Feb 2025   CCA Employment/Education Employment/Work Situation: Employment / Work Situation Employment Situation: Unemployed Patient's Job has Been Impacted by Current Illness: No Has Patient ever Been in Equities trader?: No  Education: Education Is Patient Currently Attending School?: No Last Grade Completed: 12 Did You Product manager?: No Did You Have An Individualized Education Program (IIEP): No Did You Have Any Difficulty At [employer]?: No Patient's Education Has Been Impacted by Current Illness: No   CCA Family/Childhood History Family and Relationship History: Family history Marital status: Single Does patient have children?: No  Childhood History:  Childhood History By whom was/is the patient raised?: Mother, Father Did patient suffer any verbal/emotional/physical/sexual abuse as a child?: No Did patient suffer from severe childhood neglect?: No Has patient ever been sexually abused/assaulted/raped as an adolescent or adult?: No Was the patient ever a victim of a crime or a  disaster?: No Witnessed domestic violence?: No Has patient been affected by domestic violence as an adult?: No       CCA Substance Use Alcohol/Drug Use: Alcohol / Drug Use Pain Medications: See MAR Prescriptions: See MAR Over the Counter: See MAR History of alcohol / drug use?: Yes Longest period of sobriety (when/how long): N/A Negative Consequences of Use: Financial Withdrawal Symptoms: None Substance #1 Name of Substance 1: THC 1 - Age of First Use: 19 1 - Amount (size/oz): 3-4 blunts 1 - Frequency: daily 1 - Duration: since age 67 - 61-18 yrs 1 - Last Use / Amount: yesterday - 3-4 blunts 1 - Method of Aquiring: buys 1- Route of Use: smokes                       ASAM's:  Six Dimensions of Multidimensional Assessment  Dimension 1:  Acute Intoxication and/or Withdrawal Potential:   Dimension 1:  Description of individual's past and current experiences of substance use and withdrawal: None  Dimension 2:  Biomedical Conditions and Complications:   Dimension 2:  Description of patient's biomedical conditions and  complications: Able to cope with physical discomfort/pain.  No major medical concerns.  Dimension 3:  Emotional, Behavioral, or Cognitive Conditions and Complications:  Dimension 3:  Description of emotional, behavioral, or cognitive conditions and complications: Underlyng mental health issues - Depression vs Biopolar (undiagnosed)  Dimension 4:  Readiness to Change:  Dimension 4:  Description of Readiness to Change criteria: seeking treatment  Dimension 5:  Relapse, Continued use, or Continued Problem Potential:  Dimension 5:  Relapse, continued use, or continued problem potential critiera description: Hoping to not self-medicate and seek psychiatric treatment for mental health issues symptoms.  Dimension 6:  Recovery/Living Environment:  Dimension 6:  Recovery/Iiving environment criteria description: family is supportive  ASAM Severity Score: ASAM's Severity  Rating Score: 4  ASAM Recommended Level of Treatment: ASAM Recommended Level of Treatment: Level II Intensive Outpatient Treatment   Substance use Disorder (SUD) Substance Use Disorder (SUD)  Checklist Symptoms of Substance Use: Persistent desire or unsuccessful efforts to cut down or control use, Presence of craving or strong urge to use, Substance(s) often taken  in larger amounts or over longer times than was intended  Recommendations for Services/Supports/Treatments: Recommendations for Services/Supports/Treatments Recommendations For Services/Supports/Treatments: CD-IOP Intensive Chemical Dependency Program, Individual Therapy, Medication Management  Disposition Recommendation per psychiatric provider: There are no psychiatric contraindications to discharge at this time   DSM5 Diagnoses: Patient Active Problem List   Diagnosis Date Noted   Sleep disorder with mood complaints 02/26/2024   Obesity (BMI 30-39.9) 02/26/2024   Chronic bilateral low back pain without sciatica 01/05/2024   Severe episode of recurrent major depressive disorder, with psychotic features (HCC) 03/22/2022   Asthma 11/08/2011     Referrals to Alternative Service(s): Referred to Alternative Service(s):   Place:   Date:   Time:    Referred to Alternative Service(s):   Place:   Date:   Time:    Referred to Alternative Service(s):   Place:   Date:   Time:    Referred to Alternative Service(s):   Place:   Date:   Time:     Wilbur Handing, [facility]

## 2024-05-06 LAB — CERVICOVAGINAL ANCILLARY ONLY
Bacterial Vaginitis (gardnerella): POSITIVE — AB
Candida Glabrata: NEGATIVE
Candida Vaginitis: NEGATIVE
Chlamydia: NEGATIVE
Comment: NEGATIVE
Comment: NEGATIVE
Comment: NEGATIVE
Comment: NEGATIVE
Comment: NEGATIVE
Comment: NORMAL
Neisseria Gonorrhea: NEGATIVE
Trichomonas: NEGATIVE

## 2024-05-06 LAB — URINE CULTURE: Culture: <10000 — AB

## 2024-05-07 ENCOUNTER — Ambulatory Visit (HOSPITAL_COMMUNITY): Payer: Self-pay

## 2024-05-07 MED ORDER — METRONIDAZOLE 500 MG PO TABS: 500.0000 mg | ORAL_TABLET | Freq: Two times a day (BID) | ORAL | 0 refills | Status: AC

## 2024-05-12 ENCOUNTER — Telehealth (HOSPITAL_COMMUNITY): Payer: Self-pay

## 2024-05-12 ENCOUNTER — Ambulatory Visit (HOSPITAL_COMMUNITY)

## 2024-05-12 ENCOUNTER — Encounter (HOSPITAL_COMMUNITY): Payer: Self-pay

## 2024-05-12 NOTE — Telephone Encounter (Signed)
 Therapist reaches out to Teresa Peterson via telephone as she missed her appointment today to f/u for CD-IOP. The message on the phone says the number cannot be completed as dialed.  Therapist will send a letter via MY CHART.  Earnie Gola, MS, LMFT, LCAS

## 2024-05-21 NOTE — Progress Notes (Signed)
 pt was a no show

## 2024-06-17 ENCOUNTER — Encounter: Payer: Self-pay | Admitting: Family

## 2024-06-17 NOTE — Telephone Encounter (Signed)
 also seen in ED for symptoms, Requires OV or virtual

## 2024-06-23 ENCOUNTER — Ambulatory Visit: Payer: Self-pay | Admitting: Family

## 2024-06-23 ENCOUNTER — Ambulatory Visit: Admitting: Family

## 2024-06-23 ENCOUNTER — Other Ambulatory Visit (HOSPITAL_COMMUNITY)
Admission: RE | Admit: 2024-06-23 | Discharge: 2024-06-23 | Disposition: A | Discharge status: Home / Self Care | Source: Ambulatory Visit | Attending: Family | Admitting: Family

## 2024-06-23 VITALS — BP 140/88 | HR 90 | Temp 98.2°F | Ht 63.0 in | Wt 220.4 lb

## 2024-06-23 DIAGNOSIS — F333 Major depressive disorder, recurrent, severe with psychotic symptoms: Secondary | ICD-10-CM | POA: Diagnosis not present

## 2024-06-23 DIAGNOSIS — E282 Polycystic ovarian syndrome: Secondary | ICD-10-CM | POA: Insufficient documentation

## 2024-06-23 DIAGNOSIS — Z113 Encounter for screening for infections with a predominantly sexual mode of transmission: Secondary | ICD-10-CM | POA: Insufficient documentation

## 2024-06-23 DIAGNOSIS — B379 Candidiasis, unspecified: Secondary | ICD-10-CM

## 2024-06-23 DIAGNOSIS — R300 Dysuria: Secondary | ICD-10-CM

## 2024-06-23 DIAGNOSIS — B9689 Other specified bacterial agents as the cause of diseases classified elsewhere: Secondary | ICD-10-CM

## 2024-06-23 LAB — POCT URINALYSIS DIPSTICK
Bilirubin, UA: NEGATIVE
Blood, UA: NEGATIVE
Glucose, UA: NEGATIVE
Ketones, UA: NEGATIVE
Leukocytes, UA: NEGATIVE
Nitrite, UA: NEGATIVE
Protein, UA: NEGATIVE
Spec Grav, UA: 1.025 (ref 1.010–1.025)
Urobilinogen, UA: 0.2 U/dL
pH, UA: 6 (ref 5.0–8.0)

## 2024-06-23 NOTE — Assessment & Plan Note (Signed)
 Previous hospital visit in May and sent for outpatient therapy, but she missed follow-up. Effexor  RX back in March caused recurrent nausea/vomiting. Emphasized importance of psychiatric and therapeutic support. - Provide contact information for psychiatric services. - Send new referral for psychiatric and therapy services.

## 2024-06-23 NOTE — Assessment & Plan Note (Signed)
 PCOS with possible cyst flare-up. Symptoms may relate to PCOS or other gynecological issues. - Refer to gynecology for evaluation and management. - Consider ultrasound if symptoms persist or worsen.

## 2024-06-23 NOTE — Patient Instructions (Addendum)
 It was very nice to see you today!   I have sent a new referral to [facility]. But you have to call their office to schedule your first appointment.  Call [facility] at [phone].   I also sent a referral to gynecology to follow up on your PCOS and getting a PAP smear.  Let me know if you don't hear from either place by end of next week.       PLEASE NOTE:  If you had any lab tests please let us  know if you have not heard back within a few days. You may see your results on MyChart before we have a chance to review them but we will give you a call once they are reviewed by us . If we ordered any referrals today, please let us  know if you have not heard from their office within the next week.

## 2024-06-23 NOTE — Progress Notes (Addendum)
 Patient ID: Teresa Peterson, female    DOB: 05/24/1986, 38 y.o.   MRN: [id number]  Chief Complaint  Patient presents with   Medication Problem    Venlafaxine  side effects: throw up for first two weeks. States it was effecting Ph balance and causing vaginal irration, when she stopped this went away. Start medication back and vomitting and irritation started again. Bottom right ovarian area swollen. Ask for STD testing. UTI test as well.   Discussed the use of AI scribe software for clinical note transcription with the patient, who gave verbal consent to proceed.  History of Present Illness Teresa Peterson is a 38 year old female with PCOS who presents for STD testing and evaluation of pelvic symptoms.  Pelvic pain and discomfort - Pelvic symptoms present for two weeks, described as low right suprapubic and 'hard' - Symptoms possibly related to PCOS and a cyst flare-up - Also reports Soreness during intercourse  Vaginal discharge - Milky vaginal discharge without unpleasant odor - Discharge was initially thick, then changed to a milky consistency with a 'real red' appearance  Genitourinary irritation - Mild burning sensation and irritation - Concern for possible urinary tract infection  Sexual and reproductive health history - Previous diagnosis of bacterial vaginosis in May, treated with antibiotics - Concern for recurrence of chlamydia infection - Not on hormonal medications - Inconsistent condom use during sexual activity  Assessment & Plan Sexually Transmitted Infection (STI) Screening Requested STI testing due to symptoms and past chlamydia. Differential includes chlamydia, BV, and UTI. Previous BV treatment lacked antifungal prophylaxis. - Perform STI screening via vaginal swab. - Check urine for UTI. - Treat for BV if indicated and consider antifungal treatment if symptoms suggest. - Advise consistent condom use.  Polycystic Ovary Syndrome (PCOS) PCOS  with possible cyst flare-up. Symptoms may relate to PCOS or other gynecological issues. - Refer to gynecology for evaluation and management. - Consider ultrasound if symptoms persist or worsen.  Severe depression w/psychotic episode Previous hospital visit in May and sent for outpatient therapy, but she missed follow-up. Effexor  RX back in March caused recurrent nausea/vomiting. Emphasized importance of psychiatric and therapeutic support. - Provide contact information for psychiatric services. - Send new referral for psychiatric and therapy services.  General Health Maintenance Due for Pap smear and lacks a locally established gynecologist. - Refer to gynecology for Pap smear and routine care.   Subjective:    Outpatient Medications Prior to Visit  Medication Sig Dispense Refill   albuterol  (VENTOLIN  HFA) 108 (90 Base) MCG/ACT inhaler Inhale 1-2 puffs into the lungs every 6 (six) hours as needed for wheezing or shortness of breath. 1 each 0   venlafaxine  XR (EFFEXOR  XR) 37.5 MG 24 hr capsule Take 1 capsule (37.5 mg total) by mouth daily with breakfast. 30 capsule 2   cyclobenzaprine  (FLEXERIL ) 5 MG tablet Take 1 tablet (5 mg total) by mouth at bedtime. (Patient not taking: Reported on 06/23/2024) 30 tablet 0   hydrOXYzine  (ATARAX ) 10 MG tablet Take 1 tablet (10 mg total) by mouth 2 (two) times daily as needed for anxiety (Can take 2-3 pills for sleep if needed.). (Patient not taking: Reported on 06/23/2024) 60 tablet 2   loperamide  (IMODIUM ) 2 MG capsule Take 1 capsule (2 mg total) by mouth 2 (two) times daily as needed for diarrhea or loose stools. (Patient not taking: Reported on 06/23/2024) 14 capsule 0   ondansetron  (ZOFRAN -ODT) 8 MG disintegrating tablet Take 1 tablet (8 mg total) by mouth  every 8 (eight) hours as needed for nausea or vomiting. (Patient not taking: Reported on 06/23/2024) 20 tablet 0   No facility-administered medications prior to visit.   Past Medical History:  Diagnosis  Date   Allergy    Anemia September 28, 1986   Born anemic, grew out of it   Asthma    Depression    Emphysema of lung (HCC)    GERD (gastroesophageal reflux disease)    Headache(784.0)    Migraines w/Aura   Heart murmur    HSV infection    Hx of vaginal herpes   Noncompliance with medications    Right shoulder tendonitis 03/22/2022   STD (sexually transmitted disease) 02/11/2022   Tx'd for chlamydia x3   Tinea versicolor 11/08/2011   No past surgical history on file. Allergies  Allergen Reactions   Peanut Oil Anaphylaxis    peanuts   Pseudoephedrine Hcl Itching and Shortness Of Breath   Sudafed [Pseudoephedrine Hcl] Itching      Objective:    Physical Exam Vitals and nursing note reviewed.  Constitutional:      Appearance: Normal appearance. She is obese.  Cardiovascular:     Rate and Rhythm: Normal rate and regular rhythm.  Pulmonary:     Effort: Pulmonary effort is normal.     Breath sounds: Normal breath sounds.  Musculoskeletal:        General: Normal range of motion.  Skin:    General: Skin is warm and dry.  Neurological:     Mental Status: She is alert.  Psychiatric:        Mood and Affect: Mood normal.        Behavior: Behavior normal.    BP (!) 140/88   Pulse 90   Temp 98.2 F (36.8 C) (Temporal)   Ht 5' 3 (1.6 m)   Wt 220 lb 6.4 oz (100 kg)   SpO2 93%   BMI 39.04 kg/m  Wt Readings from Last 3 Encounters:  06/23/24 220 lb 6.4 oz (100 kg)  02/26/24 212 lb 3.2 oz (96.3 kg)  01/24/24 206 lb (93.4 kg)      Lucius Krabbe, NP

## 2024-06-24 LAB — CERVICOVAGINAL ANCILLARY ONLY
Bacterial Vaginitis (gardnerella): POSITIVE — AB
Candida Glabrata: NEGATIVE
Candida Vaginitis: NEGATIVE
Chlamydia: NEGATIVE
Comment: NEGATIVE
Comment: NEGATIVE
Comment: NEGATIVE
Comment: NEGATIVE
Comment: NEGATIVE
Comment: NORMAL
Neisseria Gonorrhea: NEGATIVE
Trichomonas: NEGATIVE

## 2024-06-24 MED ORDER — FLUCONAZOLE 150 MG PO TABS
ORAL_TABLET | ORAL | 0 refills | Status: DC
Start: 2024-06-24 — End: 2024-10-15

## 2024-06-24 MED ORDER — METRONIDAZOLE 500 MG PO TABS: 500.0000 mg | ORAL_TABLET | Freq: Two times a day (BID) | ORAL | 0 refills | Status: AC

## 2024-08-07 ENCOUNTER — Telehealth: Admitting: Family Medicine

## 2024-08-07 DIAGNOSIS — J45901 Unspecified asthma with (acute) exacerbation: Secondary | ICD-10-CM | POA: Diagnosis not present

## 2024-08-07 MED ORDER — ALBUTEROL SULFATE HFA 108 (90 BASE) MCG/ACT IN AERS: 1.0000 | INHALATION_SPRAY | Freq: Four times a day (QID) | RESPIRATORY_TRACT | 0 refills | Status: DC | PRN

## 2024-08-07 MED ORDER — BUDESONIDE-FORMOTEROL FUMARATE 80-4.5 MCG/ACT IN AERO: 2.0000 | INHALATION_SPRAY | Freq: Two times a day (BID) | RESPIRATORY_TRACT | 0 refills | Status: DC

## 2024-08-07 MED ORDER — PREDNISONE 10 MG (21) PO TBPK: ORAL_TABLET | ORAL | 0 refills | Status: DC

## 2024-08-07 NOTE — Progress Notes (Signed)
 E-Visit for Asthma  Based on what you have shared with me, it looks like you may have a flare up of your asthma.  Asthma is a chronic (ongoing) lung disease which results in airway obstruction, inflammation and hyper-responsiveness.   Asthma symptoms vary from person to person, with common symptoms including nighttime awakening and decreased ability to participate in normal activities as a result of shortness of breath. It is often triggered by changes in weather, changes in the season, changes in air temperature, or inside (home, school, daycare or work) allergens such as animal dander, mold, mildew, woodstoves or cockroaches.   It can also be triggered by hormonal changes, extreme emotion, physical exertion or an upper respiratory tract illness.     It is important to identify the trigger, and then eliminate or avoid the trigger if possible.   If you have been prescribed medications to be taken on a regular basis, it is important to follow the asthma action plan and to follow guidelines to adjust medication in response to increasing symptoms of decreased peak expiratory flow rate  Treatment: I have prescribed: Albuterol (Proventil HFA; Ventolin HFA) 108 (90 Base) MCG/ACT Inhaler 2 puffs into the lungs every six hours as needed for wheezing or shortness of breath As well as symbicort and Prednisone taper.  Please do not utilize medications if you think you are pregnant.   HOME CARE Only take medications as instructed by your medical team. Consider wearing a mask or scarf to improve breathing air temperature have been shown to decrease irritation and decrease exacerbations Get rest. Taking a steamy shower or using a humidifier may help nasal congestion sand ease sore throat pain. You can place a towel over your head and breathe in the steam from hot water coming from a faucet. Using a saline nasal spray works much the same way.  Cough drops,  hare candies and sore throat lozenges may ease your cough.  Avoid close contacts especially the very you and the elderly Cover your mouth if you cough or sneeze Always remember to wash your hands.    GET HELP RIGHT AWAY IF: You develop worsening symptoms; breathlessness at rest, drowsy, confused or agitated, unable to speak in full sentences You have coughing fits You develop a severe headache or visual changes You develop shortness of breath, difficulty breathing or start having chest pain Your symptoms persist after you have completed your treatment plan If your symptoms do not improve within 10 days  MAKE SURE YOU Understand these instructions. Will watch your condition. Will get help right away if you are not doing well or get worse.   Your e-visit answers were reviewed by a board certified advanced clinical practitioner to complete your personal care plan, Depending upon the condition, your plan could have included both over the counter or prescription medications.   Please review your pharmacy choice. Your safety is important to us . If you have drug allergies check your prescription carefully.  You can use MyChart to ask questions about today's visit, request a non-urgent  call back, or ask for a work or school excuse for 24 hours related to this e-Visit. If it has been greater than 24 hours you will need to follow up with your provider, or enter a new e-Visit to address those concerns.   You will get an e-mail in the next two days asking about your experience. I hope that your e-visit has been valuable and will speed your recovery. Thank you for using e-visits.  I have spent 5 minutes in review of e-visit questionnaire, review and updating patient chart, medical decision making and response to patient.   Roosvelt Mater, PA-C

## 2024-09-30 ENCOUNTER — Ambulatory Visit
Admission: EM | Admit: 2024-09-30 | Discharge: 2024-09-30 | Disposition: A | Discharge status: Home / Self Care | Payer: MEDICAID | Attending: Family Medicine | Admitting: Family Medicine

## 2024-09-30 DIAGNOSIS — J4521 Mild intermittent asthma with (acute) exacerbation: Secondary | ICD-10-CM | POA: Diagnosis not present

## 2024-09-30 DIAGNOSIS — J45901 Unspecified asthma with (acute) exacerbation: Secondary | ICD-10-CM

## 2024-09-30 DIAGNOSIS — B349 Viral infection, unspecified: Secondary | ICD-10-CM

## 2024-09-30 LAB — POC SOFIA SARS ANTIGEN FIA: SARS Coronavirus 2 Ag: NEGATIVE

## 2024-09-30 MED ORDER — ONDANSETRON 4 MG PO TBDP: 4.0000 mg | ORAL_TABLET | Freq: Three times a day (TID) | ORAL | 0 refills | Status: AC | PRN

## 2024-09-30 MED ORDER — PREDNISONE 20 MG PO TABS: 40.0000 mg | ORAL_TABLET | Freq: Every day | ORAL | 0 refills | Status: AC

## 2024-09-30 MED ORDER — BUDESONIDE-FORMOTEROL FUMARATE 80-4.5 MCG/ACT IN AERO: 2.0000 | INHALATION_SPRAY | Freq: Two times a day (BID) | RESPIRATORY_TRACT | 0 refills | Status: DC

## 2024-09-30 MED ORDER — BENZONATATE 200 MG PO CAPS: 200.0000 mg | ORAL_CAPSULE | Freq: Three times a day (TID) | ORAL | 0 refills | Status: DC | PRN

## 2024-09-30 MED ORDER — ONDANSETRON 4 MG PO TBDP
4.0000 mg | ORAL_TABLET | Freq: Once | ORAL | Status: AC
  Administered 2024-09-30: 4 mg via ORAL

## 2024-09-30 NOTE — ED Provider Notes (Signed)
 [facility]    CSN: [id number] Arrival date & time: 09/30/24  1845      History   Chief Complaint No chief complaint on file.   HPI Teresa Peterson is a 38 y.o. female  presents for evaluation of URI symptoms for 2 days. Patient reports associated symptoms of cough, congestion, nausea/vomiting/diarrhea that is nonbloody, body aches, headache, wheezing. Denies fevers, sore throat, shortness of breath. Patient does have a hx of asthma.  Has been using her albuterol  inhaler and she has run out of her Symbicort .  Patient is not an active smoker.   Reports she was exposed to COVID.  Pt has taken nothing OTC for symptoms. Pt has no other concerns at this time.   HPI  Past Medical History:  Diagnosis Date   Allergy    Anemia January 01, 1988   Born anemic, grew out of it   Asthma    Depression    Emphysema of lung (HCC)    GERD (gastroesophageal reflux disease)    Headache(784.0)    Migraines w/Aura   Heart murmur    HSV infection    Hx of vaginal herpes   Noncompliance with medications    Right shoulder tendonitis 03/22/2022   STD (sexually transmitted disease) 02/11/2022   Tx'd for chlamydia x3   Tinea versicolor 11/08/2011    Patient Active Problem List   Diagnosis Date Noted   PCOS (polycystic ovarian syndrome) 06/23/2024   Sleep disorder with mood complaints 02/26/2024   Obesity (BMI 30-39.9) 02/26/2024   Chronic bilateral low back pain without sciatica 01/05/2024   Severe episode of recurrent major depressive disorder, 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 No pertinent surgical history.  OB History     Gravida  0   Para  0   Term  0   Preterm  0   AB  0   Living  0      SAB  0   IAB  0   Ectopic  0   Multiple  0   Live Births  0            Home Medications    Prior to Admission medications   Medication Sig Start Date End Date Taking? Authorizing Provider  benzonatate  (TESSALON )  200 MG capsule Take 1 capsule (200 mg total) by mouth 3 (three) times daily as needed. 09/30/24  Yes Teresa Peterson, Teresa Peterson, Teresa Peterson  ondansetron  (ZOFRAN -ODT) 4 MG disintegrating tablet Take 1 tablet (4 mg total) by mouth every 8 (eight) hours as needed for nausea or vomiting. 09/30/24  Yes Teresa Peterson, Teresa Peterson, Teresa Peterson  predniSONE  (DELTASONE ) 20 MG tablet Take 2 tablets (40 mg total) by mouth daily with breakfast for 5 days. 09/30/24 10/05/24 Yes Teresa Peterson, Teresa Peterson, Teresa Peterson  albuterol  (VENTOLIN  HFA) 108 (90 Base) MCG/ACT inhaler Inhale 1-2 puffs into the lungs every 6 (six) hours as needed for wheezing or shortness of breath. 08/07/24   Teresa Peterson, Shaylo, Teresa Peterson  budesonide -formoterol  (SYMBICORT ) 80-4.5 MCG/ACT inhaler Inhale 2 puffs into the lungs 2 (two) times daily. 09/30/24 10/30/24  Teresa Peterson, Teresa Peterson, Teresa Peterson  fluconazole  (DIFLUCAN ) 150 MG tablet Take 1 pill day 1 and the 2nd pill 3 days later. 06/24/24   Teresa Krabbe, Teresa Peterson    Family History Family History  Problem Relation Age of Onset   Asthma Mother    Diabetes Mother    Asthma Brother    Asthma Maternal Aunt    Diabetes Maternal Aunt  Hypertension Maternal Aunt    Asthma Maternal Grandmother    Stroke Maternal Grandmother    Diabetes Maternal Grandfather    Kidney disease Maternal Grandfather    Asthma Paternal Grandmother    Alcohol abuse Other        grandparents   Arthritis Other        grandparent   Cancer Other        relative   Stroke Other        grandparent   Kidney disease Other        grandparents   Diabetes Other        grandparent    Social History Social History   Tobacco Use   Smoking status: Some Days    Types: Cigars   Smokeless tobacco: Never   Tobacco comments:    I went cold [place] in 2012 for 3 years. 2015-2022 tired to stop countless times.  Vaping Use   Vaping status: Never Used  Substance Use Topics   Alcohol use: Not Currently    Comment: 1 bottle of wine/month   Drug use: Yes    Frequency: 3.0 times per week    Types: Marijuana      Allergies   Peanut oil, Pseudoephedrine hcl, and Sudafed [pseudoephedrine hcl]   Review of Systems Review of Systems  HENT:  Positive for congestion.   Respiratory:  Positive for cough and wheezing.   Gastrointestinal:  Positive for diarrhea, nausea and vomiting.  Neurological:  Positive for headaches.     Physical Exam Triage Vital Signs ED Triage Vitals  Encounter Vitals Group     BP 09/30/24 1924 (!) 144/83     Girls Systolic BP Percentile --      Girls Diastolic BP Percentile --      Boys Systolic BP Percentile --      Boys Diastolic BP Percentile --      Pulse Rate 09/30/24 1924 68     Resp 09/30/24 1924 17     Temp 09/30/24 1924 98.1 F (36.7 C)     Temp src --      SpO2 09/30/24 1924 95 %     Weight --      Height --      Head Circumference --      Peak Flow --      Pain Score 09/30/24 1923 7     Pain Loc --      Pain Education --      Exclude from Growth Chart --    No data found.  Updated Vital Signs BP (!) 144/83 (BP Location: Right Arm)   Pulse 68   Temp 98.1 F (36.7 C)   Resp 17   LMP 08/26/2024 (Exact Date)   SpO2 95%   Visual Acuity Right Eye Distance:   Left Eye Distance:   Bilateral Distance:    Right Eye Near:   Left Eye Near:    Bilateral Near:     Physical Exam Vitals and nursing note reviewed.  Constitutional:      General: She is not in acute distress.    Appearance: She is well-developed. She is not ill-appearing.  HENT:     Head: Normocephalic and atraumatic.     Right Ear: Tympanic membrane and ear canal normal.     Left Ear: Tympanic membrane and ear canal normal.     Nose: Congestion present.     Mouth/Throat:     Mouth: Mucous membranes are moist.  Pharynx: Oropharynx is clear. Uvula midline. No oropharyngeal exudate or posterior oropharyngeal erythema.     Tonsils: No tonsillar exudate or tonsillar abscesses.  Eyes:     Conjunctiva/sclera: Conjunctivae normal.     Pupils: Pupils are equal, round, and  reactive to light.  Cardiovascular:     Rate and Rhythm: Normal rate and regular rhythm.     Heart sounds: Normal heart sounds.  Pulmonary:     Effort: Pulmonary effort is normal.     Breath sounds: Normal breath sounds. No wheezing or rhonchi.  Musculoskeletal:     Cervical back: Normal range of motion and neck supple.  Lymphadenopathy:     Cervical: No cervical adenopathy.  Skin:    General: Skin is warm and dry.  Neurological:     General: No focal deficit present.     Mental Status: She is alert and oriented to person, place, and time.  Psychiatric:        Mood and Affect: Mood normal.        Behavior: Behavior normal.      UC Treatments / Results  Labs (all labs ordered are listed, but only abnormal results are displayed) Labs Reviewed  POC SOFIA SARS ANTIGEN FIA    EKG   Radiology No results found.  Procedures Procedures (including critical care time)  Medications Ordered in UC Medications  ondansetron  (ZOFRAN -ODT) disintegrating tablet 4 mg (has no administration in time range)    Initial Impression / Assessment and Plan / UC Course  I have reviewed the triage vital signs and the nursing notes.  Pertinent labs & imaging results that were available during my care of the patient were reviewed by me and considered in my medical decision making (see chart for details).     Reviewed exam and symptoms with patient.  No red flags.  Negative COVID testing.  Discussed viral illness with asthma exacerbation.  No wheezing on exam and patient declines nebulizer.  I have refilled her Symbicort  and will start prednisone  daily for 5 days.  Tessalon  as needed for cough.  She was given Zofran  in clinic for nausea/vomiting and Rx sent to pharmacy as well.  Encouraged rest fluids and PCP follow-up if symptoms do not improve.  ER precautions reviewed. Final Clinical Impressions(s) / UC Diagnoses   Final diagnoses:  Viral illness  Mild intermittent asthma with acute  exacerbation     Discharge Instructions      You tested negative for COVID.  You may take Tessalon  3 times a day as needed for your cough.  You were given Zofran  in the clinic for your nausea.  You may take this every 8 hours as needed for nausea or vomiting.  I have refilled your Symbicort  to use for your asthma.  I have also sent you prednisone  to take daily for 5 days.  Continue your albuterol  inhaler as needed.  Lots of rest and fluids.  Please follow-up with your PCP if your symptoms do not improve.  Please go to the ER for any worsening symptoms.  Hope you feel better soon!    ED Prescriptions     Medication Sig Dispense Auth. Provider   budesonide -formoterol  (SYMBICORT ) 80-4.5 MCG/ACT inhaler Inhale 2 puffs into the lungs 2 (two) times daily. 1 each Teresa Peterson, Teresa Peterson, Teresa Peterson   ondansetron  (ZOFRAN -ODT) 4 MG disintegrating tablet Take 1 tablet (4 mg total) by mouth every 8 (eight) hours as needed for nausea or vomiting. 10 tablet Suhailah Kwan, Teresa Peterson, Teresa Peterson   benzonatate  (  TESSALON ) 200 MG capsule Take 1 capsule (200 mg total) by mouth 3 (three) times daily as needed. 20 capsule Teresa Peterson, Teresa Peterson, Teresa Peterson   predniSONE  (DELTASONE ) 20 MG tablet Take 2 tablets (40 mg total) by mouth daily with breakfast for 5 days. 10 tablet Sylvan Sookdeo, Teresa Peterson, Teresa Peterson      PDMP not reviewed this encounter.   Loreda Myla SAUNDERS, Teresa Peterson 09/30/24 1949

## 2024-09-30 NOTE — Discharge Instructions (Signed)
 You tested negative for COVID.  You may take Tessalon  3 times a day as needed for your cough.  You were given Zofran  in the clinic for your nausea.  You may take this every 8 hours as needed for nausea or vomiting.  I have refilled your Symbicort  to use for your asthma.  I have also sent you prednisone  to take daily for 5 days.  Continue your albuterol  inhaler as needed.  Lots of rest and fluids.  Please follow-up with your PCP if your symptoms do not improve.  Please go to the ER for any worsening symptoms.  Hope you feel better soon!

## 2024-09-30 NOTE — ED Triage Notes (Signed)
 Pt present with c/o vomiting x Tuesday. Yesterday she was feeling nauseous. States she cannot smell anything and has a headache. Pt was exposed to someone with COVID.

## 2024-10-14 ENCOUNTER — Ambulatory Visit
Admission: EM | Admit: 2024-10-14 | Discharge: 2024-10-14 | Disposition: A | Discharge status: Home / Self Care | Payer: MEDICAID | Attending: Family Medicine | Admitting: Family Medicine

## 2024-10-14 ENCOUNTER — Other Ambulatory Visit: Payer: Self-pay

## 2024-10-14 DIAGNOSIS — J209 Acute bronchitis, unspecified: Secondary | ICD-10-CM

## 2024-10-14 DIAGNOSIS — J4521 Mild intermittent asthma with (acute) exacerbation: Secondary | ICD-10-CM | POA: Diagnosis not present

## 2024-10-14 DIAGNOSIS — N926 Irregular menstruation, unspecified: Secondary | ICD-10-CM

## 2024-10-14 LAB — POCT URINE PREGNANCY: Preg Test, Ur: NEGATIVE

## 2024-10-14 MED ORDER — AZITHROMYCIN 250 MG PO TABS: 250.0000 mg | ORAL_TABLET | Freq: Every day | ORAL | 0 refills | Status: DC

## 2024-10-14 MED ORDER — IPRATROPIUM-ALBUTEROL 0.5-2.5 (3) MG/3ML IN SOLN
3.0000 mL | Freq: Once | RESPIRATORY_TRACT | Status: AC
  Administered 2024-10-14: 3 mL via RESPIRATORY_TRACT

## 2024-10-14 NOTE — ED Triage Notes (Signed)
 Pt c/o SOB, lightheaded and dizzy started at 0600 today upon waking. Pt's LKW 10/13/24 2300. Pt states had a HA before she went to bed. PT is eupneic. Pt is able to speak in complete sentences

## 2024-10-14 NOTE — Discharge Instructions (Addendum)
 Please pick up the previously prescribed prescriptions for your prednisone , Symbicort , and Tessalon  for cough.  Start azithromycin  antibiotic as prescribed.  Lots of rest and fluids.  Please follow-up with your PCP in 2 to 3 days for recheck.  Please go to the ER if you develop any worsening symptoms.  Hope you feel better soon!

## 2024-10-14 NOTE — ED Provider Notes (Signed)
 [facility]    CSN: [id number] Arrival date & time: 10/14/24  1859      History   Chief Complaint Chief Complaint  Patient presents with   Shortness of Breath   Dizziness    HPI Teresa Peterson is a 38 y.o. female  presents for evaluation of URI symptoms for 3 weeks. Patient reports associated symptoms of cough, congestion with wheezing and shortness of breath. Denies N/V/D, fevers, ear pain, body aches. Patient does have a hx of asthma.  Has an inhaler that she has been using.  She is out of her Symbicort .  Patient also reports dizziness that began today.  She describes it as the room is spinning and occurs with movement or head turning.  Dizziness resolves when she lays down.  No visual changes.  Had a headache last night but no current headache, neck pain, syncope, vomiting.  Patient was seen in urgent care on 10/9 for similar symptoms.  She had a negative COVID test and was prescribed prednisone , Tessalon  and a refill of her Symbicort .  She states due to insurance issues she did not pick up these medications.  Pt has taken nothing OTC for symptoms. Pt has no other concerns at this time.    Shortness of Breath Associated symptoms: cough and wheezing   Dizziness Associated symptoms: shortness of breath     Past Medical History:  Diagnosis Date   Allergy    Anemia 1988-01-03   Born anemic, grew out of it   Asthma    Depression    Emphysema of lung (HCC)    GERD (gastroesophageal reflux disease)    Headache(784.0)    Migraines w/Aura   Heart murmur    HSV infection    Hx of vaginal herpes   Noncompliance with medications    Right shoulder tendonitis 03/22/2022   STD (sexually transmitted disease) 02/11/2022   Tx'd for chlamydia x3   Tinea versicolor 11/08/2011    Patient Active Problem List   Diagnosis Date Noted   PCOS (polycystic ovarian syndrome) 06/23/2024   Sleep disorder with mood complaints 02/26/2024   Obesity (BMI 30-39.9) 02/26/2024    Chronic bilateral low back pain without sciatica 01/05/2024   Severe episode of recurrent major depressive disorder, 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 No pertinent surgical history.  OB History     Gravida  0   Para  0   Term  0   Preterm  0   AB  0   Living  0      SAB  0   IAB  0   Ectopic  0   Multiple  0   Live Births  0            Home Medications    Prior to Admission medications   Medication Sig Start Date End Date Taking? Authorizing Provider  fexofenadine  (ALLEGRA ) 180 MG tablet Take 180 mg by mouth daily.   Yes [provider]  albuterol  (VENTOLIN  HFA) 108 (90 Base) MCG/ACT inhaler Inhale 1-2 puffs into the lungs every 6 (six) hours as needed for wheezing or shortness of breath. 08/07/24   Reyes, Shaylo, PA-C  azithromycin  (ZITHROMAX ) 250 MG tablet Take 1 tablet (250 mg total) by mouth daily. Take first 2 tablets together, then 1 every day until finished. 10/14/24  Yes Crissie Aloi, Jodi R, NP  benzonatate  (TESSALON ) 200 MG capsule Take 1 capsule (200 mg total) by mouth 3 (three)  times daily as needed. 09/30/24   Miro Balderson, Jodi R, NP  budesonide -formoterol  (SYMBICORT ) 80-4.5 MCG/ACT inhaler Inhale 2 puffs into the lungs 2 (two) times daily. 09/30/24 10/30/24  Jayr Lupercio, Jodi R, NP  fluconazole  (DIFLUCAN ) 150 MG tablet Take 1 pill day 1 and the 2nd pill 3 days later. 06/24/24   Lucius Krabbe, NP  ondansetron  (ZOFRAN -ODT) 4 MG disintegrating tablet Take 1 tablet (4 mg total) by mouth every 8 (eight) hours as needed for nausea or vomiting. 09/30/24   Loreda Myla SAUNDERS, NP    Family History Family History  Problem Relation Age of Onset   Asthma Mother    Diabetes Mother    Asthma Brother    Asthma Maternal Aunt    Diabetes Maternal Aunt    Hypertension Maternal Aunt    Asthma Maternal Grandmother    Stroke Maternal Grandmother    Diabetes Maternal Grandfather    Kidney disease Maternal Grandfather    Asthma  Paternal Grandmother    Alcohol abuse Other        grandparents   Arthritis Other        grandparent   Cancer Other        relative   Stroke Other        grandparent   Kidney disease Other        grandparents   Diabetes Other        grandparent    Social History Social History   Tobacco Use   Smoking status: Some Days    Types: Cigars   Smokeless tobacco: Never   Tobacco comments:    I went cold [place] in 2012 for 3 years. 2015-2022 tired to stop countless times.  Vaping Use   Vaping status: Never Used  Substance Use Topics   Alcohol use: Not Currently    Comment: 1 bottle of wine/month   Drug use: Yes    Frequency: 3.0 times per week    Types: Marijuana     Allergies   Peanut oil, Pseudoephedrine hcl, and Sudafed [pseudoephedrine hcl]   Review of Systems Review of Systems  HENT:  Positive for congestion.   Respiratory:  Positive for cough, shortness of breath and wheezing.   Neurological:  Positive for dizziness.     Physical Exam Triage Vital Signs ED Triage Vitals  Encounter Vitals Group     BP 10/14/24 1909 126/85     Girls Systolic BP Percentile --      Girls Diastolic BP Percentile --      Boys Systolic BP Percentile --      Boys Diastolic BP Percentile --      Pulse Rate 10/14/24 1909 96     Resp 10/14/24 1909 18     Temp 10/14/24 1909 99 F (37.2 C)     Temp Source 10/14/24 1909 Oral     SpO2 10/14/24 1909 98 %     Weight --      Height --      Head Circumference --      Peak Flow --      Pain Score 10/14/24 1908 0     Pain Loc --      Pain Education --      Exclude from Growth Chart --    No data found.  Updated Vital Signs BP 126/85   Pulse 96   Temp 99 F (37.2 C) (Oral)   Resp 18   LMP 08/26/2024 (Exact Date)   SpO2 98%   Visual Acuity  Right Eye Distance:   Left Eye Distance:   Bilateral Distance:    Right Eye Near:   Left Eye Near:    Bilateral Near:     Physical Exam Vitals and nursing note reviewed.   Constitutional:      General: She is not in acute distress.    Appearance: She is well-developed. She is not ill-appearing.  HENT:     Head: Normocephalic and atraumatic.     Right Ear: Tympanic membrane and ear canal normal.     Left Ear: Tympanic membrane and ear canal normal.     Nose: Congestion present.     Mouth/Throat:     Mouth: Mucous membranes are moist.     Pharynx: Oropharynx is clear. Uvula midline. No oropharyngeal exudate or posterior oropharyngeal erythema.     Tonsils: No tonsillar exudate or tonsillar abscesses.  Eyes:     Conjunctiva/sclera: Conjunctivae normal.     Pupils: Pupils are equal, round, and reactive to light.  Cardiovascular:     Rate and Rhythm: Normal rate and regular rhythm.     Heart sounds: Normal heart sounds.  Pulmonary:     Effort: Pulmonary effort is normal.     Breath sounds: Normal breath sounds. No wheezing or rhonchi.  Musculoskeletal:     Cervical back: Normal range of motion and neck supple.  Lymphadenopathy:     Cervical: No cervical adenopathy.  Skin:    General: Skin is warm and dry.  Neurological:     General: No focal deficit present.     Mental Status: She is alert and oriented to person, place, and time.     GCS: GCS eye subscore is 4. GCS verbal subscore is 5. GCS motor subscore is 6.     Cranial Nerves: No facial asymmetry.     Motor: No weakness.     Gait: Gait normal.  Psychiatric:        Mood and Affect: Mood normal.        Behavior: Behavior normal.      UC Treatments / Results  Labs (all labs ordered are listed, but only abnormal results are displayed) Labs Reviewed  POCT URINE PREGNANCY    EKG   Radiology No results found.  Procedures Procedures (including critical care time)  Medications Ordered in UC Medications  ipratropium-albuterol  (DUONEB) 0.5-2.5 (3) MG/3ML nebulizer solution 3 mL (3 mLs Nebulization Given 10/14/24 1953)    Initial Impression / Assessment and Plan / UC Course  I have  reviewed the triage vital signs and the nursing notes.  Pertinent labs & imaging results that were available during my care of the patient were reviewed by me and considered in my medical decision making (see chart for details).     Reviewed exam and symptoms with patient.  Urine hCG negative.  No wheezing on exam but she does report shortness of breath so nebulizer was given per patient request.  She reports improvement of her shortness of breath.  Discussed likely bronchitis with asthma exacerbation.  She states she will pick up the previous prescriptions that were prescribed 2 weeks ago with the pharmacy including her prednisone , Symbicort  and her cough medicine.  Will start azithromycin .  Encourage lots of rest and fluids. advised PCP follow-up in 2 days for recheck.  Strict ER precautions reviewed and patient verbalized understanding Final Clinical Impressions(s) / UC Diagnoses   Final diagnoses:  Late menses  Mild intermittent asthma with acute exacerbation  Acute bronchitis, unspecified organism  Discharge Instructions      Please pick up the previously prescribed prescriptions for your prednisone , Symbicort , and Tessalon  for cough.  Start azithromycin  antibiotic as prescribed.  Lots of rest and fluids.  Please follow-up with your PCP in 2 to 3 days for recheck.  Please go to the ER if you develop any worsening symptoms.  Hope you feel better soon!     ED Prescriptions     Medication Sig Dispense Auth. Provider   azithromycin  (ZITHROMAX ) 250 MG tablet Take 1 tablet (250 mg total) by mouth daily. Take first 2 tablets together, then 1 every day until finished. 6 tablet Buffi Ewton, Jodi R, NP      PDMP not reviewed this encounter.   Loreda Myla SAUNDERS, NP 10/14/24 2006

## 2024-10-15 ENCOUNTER — Other Ambulatory Visit: Payer: Self-pay

## 2024-10-15 ENCOUNTER — Ambulatory Visit
Admission: RE | Admit: 2024-10-15 | Discharge: 2024-10-15 | Disposition: A | Discharge status: Home / Self Care | Source: Ambulatory Visit | Attending: Family Medicine | Admitting: Family Medicine

## 2024-10-15 VITALS — BP 161/100 | HR 96 | Temp 98.1°F | Resp 16

## 2024-10-15 DIAGNOSIS — B379 Candidiasis, unspecified: Secondary | ICD-10-CM | POA: Diagnosis present

## 2024-10-15 DIAGNOSIS — T3695XA Adverse effect of unspecified systemic antibiotic, initial encounter: Secondary | ICD-10-CM | POA: Insufficient documentation

## 2024-10-15 DIAGNOSIS — Z202 Contact with and (suspected) exposure to infections with a predominantly sexual mode of transmission: Secondary | ICD-10-CM | POA: Insufficient documentation

## 2024-10-15 DIAGNOSIS — N898 Other specified noninflammatory disorders of vagina: Secondary | ICD-10-CM | POA: Diagnosis present

## 2024-10-15 LAB — POCT URINALYSIS DIP (MANUAL ENTRY)
Bilirubin, UA: NEGATIVE
Blood, UA: NEGATIVE
Glucose, UA: NEGATIVE mg/dL
Ketones, POC UA: NEGATIVE mg/dL
Leukocytes, UA: NEGATIVE
Nitrite, UA: NEGATIVE
Spec Grav, UA: 1.025 (ref 1.010–1.025)
Urobilinogen, UA: 0.2 U/dL
pH, UA: 6 (ref 5.0–8.0)

## 2024-10-15 LAB — POCT URINE PREGNANCY: Preg Test, Ur: NEGATIVE

## 2024-10-15 MED ORDER — FLUCONAZOLE 150 MG PO TABS: ORAL_TABLET | ORAL | 0 refills | Status: DC

## 2024-10-15 MED ORDER — METRONIDAZOLE 500 MG PO TABS: ORAL_TABLET | ORAL | 0 refills | Status: DC

## 2024-10-15 NOTE — ED Provider Notes (Signed)
 Teresa Peterson    CSN: [id number] Arrival date & time: 10/15/24  1546      History   Chief Complaint Chief Complaint  Patient presents with   Vaginal Discharge   Emesis   Diarrhea    HPI Teresa Peterson is a 38 y.o. female.   Patient complains of a thick white runny vaginal discharge for 3 days, and onset of dysuria last night.  She denies abdominal/pelvic pain and fever.  She was diagnosed with bronchitis yesterday, receiving an Rx for azithromycin  which she has not yet started.  She notes that she has had loose stools and and occasional nausea/vomiting for 4 days.  Patient's last menstrual period was 08/26/2024 (exact date).  Patient has a history of recurring BV.  The history is provided by the patient.    Past Medical History:  Diagnosis Date   Allergy    Anemia 09/02/88   Born anemic, grew out of it   Asthma    Depression    Emphysema of lung (HCC)    GERD (gastroesophageal reflux disease)    Headache(784.0)    Migraines w/Aura   Heart murmur    HSV infection    Hx of vaginal herpes   Noncompliance with medications    Right shoulder tendonitis 03/22/2022   STD (sexually transmitted disease) 02/11/2022   Tx'd for chlamydia x3   Tinea versicolor 11/08/2011    Patient Active Problem List   Diagnosis Date Noted   PCOS (polycystic ovarian syndrome) 06/23/2024   Sleep disorder with mood complaints 02/26/2024   Obesity (BMI 30-39.9) 02/26/2024   Chronic bilateral low back pain without sciatica 01/05/2024   Severe episode of recurrent major depressive disorder, 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 No pertinent surgical history.  OB History     Gravida  0   Para  0   Term  0   Preterm  0   AB  0   Living  0      SAB  0   IAB  0   Ectopic  0   Multiple  0   Live Births  0            Home Medications    Prior to Admission medications   Medication Sig Start Date End Date  Taking? Authorizing Provider  metroNIDAZOLE  (FLAGYL ) 500 MG tablet Take one tab by mouth every 12 hours for 7 days. 10/15/24  Yes Pauline Garnette LABOR, MD  albuterol  (VENTOLIN  HFA) 108 (90 Base) MCG/ACT inhaler Inhale 1-2 puffs into the lungs every 6 (six) hours as needed for wheezing or shortness of breath. 08/07/24   Kingston Robes, PA-C  azithromycin  (ZITHROMAX ) 250 MG tablet Take 1 tablet (250 mg total) by mouth daily. Take first 2 tablets together, then 1 every day until finished. 10/14/24   Mayer, Jodi R, NP  benzonatate  (TESSALON ) 200 MG capsule Take 1 capsule (200 mg total) by mouth 3 (three) times daily as needed. 09/30/24   Mayer, Jodi R, NP  budesonide -formoterol  (SYMBICORT ) 80-4.5 MCG/ACT inhaler Inhale 2 puffs into the lungs 2 (two) times daily. 09/30/24 10/30/24  Mayer, Jodi R, NP  fexofenadine  (ALLEGRA ) 180 MG tablet Take 180 mg by mouth daily.    [provider]  fluconazole  (DIFLUCAN ) 150 MG tablet Take one tab PO as a single dose.  May repeat 2 days later. 10/15/24   Pauline Garnette LABOR, MD  ondansetron  (ZOFRAN -ODT) 4 MG disintegrating tablet Take 1  tablet (4 mg total) by mouth every 8 (eight) hours as needed for nausea or vomiting. 09/30/24   Loreda Myla SAUNDERS, NP    Family History Family History  Problem Relation Age of Onset   Asthma Mother    Diabetes Mother    Asthma Brother    Asthma Maternal Aunt    Diabetes Maternal Aunt    Hypertension Maternal Aunt    Asthma Maternal Grandmother    Stroke Maternal Grandmother    Diabetes Maternal Grandfather    Kidney disease Maternal Grandfather    Asthma Paternal Grandmother    Alcohol abuse Other        grandparents   Arthritis Other        grandparent   Cancer Other        relative   Stroke Other        grandparent   Kidney disease Other        grandparents   Diabetes Other        grandparent    Social History Social History   Tobacco Use   Smoking status: Some Days    Types: Cigars   Smokeless tobacco: Never    Tobacco comments:    I went cold turkey in 2012 for 3 years. 2015-2022 tired to stop countless times.  Vaping Use   Vaping status: Never Used  Substance Use Topics   Alcohol use: Not Currently    Comment: 1 bottle of wine/month   Drug use: Yes    Frequency: 3.0 times per week    Types: Marijuana     Allergies   Peanut oil, Pseudoephedrine hcl, and Sudafed [pseudoephedrine hcl]   Review of Systems Review of Systems No sore throat No cough No pleuritic pain No wheezing No nasal congestion No post-nasal drainage No sinus pain/pressure No itchy/red eyes No earache No hemoptysis No SOB No fever/chills + nausea + vomiting No abdominal pain + diarrhea + dysuria + vaginal discharge No skin rash + fatigue No myalgias No headache Used OTC meds (Allegra , Mucinex) without relief   Physical Exam Triage Vital Signs ED Triage Vitals  Encounter Vitals Group     BP 10/15/24 1549 (!) 161/100     Girls Systolic BP Percentile --      Girls Diastolic BP Percentile --      Boys Systolic BP Percentile --      Boys Diastolic BP Percentile --      Pulse Rate 10/15/24 1549 96     Resp 10/15/24 1549 16     Temp 10/15/24 1549 98.1 F (36.7 C)     Temp src --      SpO2 10/15/24 1549 97 %     Weight --      Height --      Head Circumference --      Peak Flow --      Pain Score 10/15/24 1553 0     Pain Loc --      Pain Education --      Exclude from Growth Chart --    No data found.  Updated Vital Signs BP (!) 161/100   Pulse 96   Temp 98.1 F (36.7 C)   Resp 16   LMP 08/26/2024 (Exact Date)   SpO2 97%   Visual Acuity Right Eye Distance:   Left Eye Distance:   Bilateral Distance:    Right Eye Near:   Left Eye Near:    Bilateral Near:     Physical Exam  Nursing notes and Vital Signs reviewed. Appearance:  Patient appears stated age, and in no acute distress Eyes:  Pupils are equal, round, and reactive to light and accomodation.  Extraocular movement is intact.   Conjunctivae are not inflamed  Nose:  Normal turbinates.  No sinus tenderness. Pharynx:  Normal Neck:  Supple. No adenopathy. Lungs:  Clear to auscultation.  Breath sounds are equal.  Moving air well. Heart:  Regular rate and rhythm without murmurs, rubs, or gallops.  Abdomen:  Nontender without masses or hepatosplenomegaly.  Bowel sounds are present.  No CVA or flank tenderness.  Extremities:  No edema.  Skin:  No rash present.  Pelvic exam: deferred  UC Treatments / Results  Labs (all labs ordered are listed, but only abnormal results are displayed) Labs Reviewed  POCT URINALYSIS DIP (MANUAL ENTRY) - Abnormal; Notable for the following components:      Result Value   Protein Ur, POC trace (*)    All other components within normal limits  RPR  HIV ANTIBODY (ROUTINE TESTING W REFLEX)  POCT URINE PREGNANCY negative  CERVICOVAGINAL ANCILLARY ONLY    EKG   Radiology No results found.  Procedures Procedures (including critical Peterson time)  Medications Ordered in UC Medications - No data to display  Initial Impression / Assessment and Plan / UC Course  I have reviewed the triage vital signs and the nursing notes.  Pertinent labs & imaging results that were available during my Peterson of the patient were reviewed by me and considered in my medical decision making (see chart for details).    Suspect bacterial vaginosis. Begin empiric Flagyl  500gm Q12hr for one week.  Lab tests pending as above.   Advised to fill her prescription for azithromycin  prescribed yesterday for bronchitis.  Rx for Diflucan  at patient's request.  She reports that she has Zofran  if needed for nausea. Followup with Family Doctor if not improved in one week.   Final Clinical Impressions(s) / UC Diagnoses   Final diagnoses:  Vaginal discharge  Possible exposure to STD     Discharge Instructions      Avoid sexual contact until 7 days after medications completed. Begin Diflucan  if symptoms of  candida vaginal yeast infection develop.  If symptoms become significantly worse during the night or over the weekend, proceed to the local emergency room.     ED Prescriptions     Medication Sig Dispense Auth. Provider   fluconazole  (DIFLUCAN ) 150 MG tablet Take one tab PO as a single dose.  May repeat 2 days later. 2 tablet Pauline Garnette LABOR, MD   metroNIDAZOLE  (FLAGYL ) 500 MG tablet Take one tab by mouth every 12 hours for 7 days. 14 tablet Pauline Garnette LABOR, MD         Pauline Garnette LABOR, MD 10/16/24 [phone]

## 2024-10-15 NOTE — ED Triage Notes (Addendum)
 Has c/o thick white runny discharge x 3 days and burning urination since last night. Reports stool is yellow, diarrhea. Was nauseous Monday, has been vomiting intermittently since then. Reports vomit is yellow. Dx with bronchitis yesterday. No fever. Has been on allegra  and mucinex. Will start azithromycin  later today, has to pick up rx.

## 2024-10-15 NOTE — Discharge Instructions (Signed)
 Avoid sexual contact until 7 days after medications completed. Begin Diflucan  if symptoms of candida vaginal yeast infection develop.  If symptoms become significantly worse during the night or over the weekend, proceed to the local emergency room.

## 2024-10-16 LAB — HIV ANTIBODY (ROUTINE TESTING W REFLEX): HIV Screen 4th Generation wRfx: NONREACTIVE

## 2024-10-16 LAB — RPR: RPR Ser Ql: NONREACTIVE

## 2024-10-19 ENCOUNTER — Ambulatory Visit (HOSPITAL_COMMUNITY): Payer: Self-pay

## 2024-10-19 LAB — CERVICOVAGINAL ANCILLARY ONLY
Bacterial Vaginitis (gardnerella): POSITIVE — AB
Candida Glabrata: NEGATIVE
Candida Vaginitis: NEGATIVE
Chlamydia: NEGATIVE
Comment: NEGATIVE
Comment: NEGATIVE
Comment: NEGATIVE
Comment: NEGATIVE
Comment: NEGATIVE
Comment: NORMAL
Neisseria Gonorrhea: NEGATIVE
Trichomonas: NEGATIVE

## 2024-11-05 ENCOUNTER — Telehealth: Admitting: Physician Assistant

## 2024-11-05 ENCOUNTER — Encounter

## 2024-11-05 DIAGNOSIS — T3695XA Adverse effect of unspecified systemic antibiotic, initial encounter: Secondary | ICD-10-CM | POA: Diagnosis not present

## 2024-11-05 DIAGNOSIS — B379 Candidiasis, unspecified: Secondary | ICD-10-CM | POA: Diagnosis not present

## 2024-11-05 MED ORDER — FLUCONAZOLE 150 MG PO TABS: ORAL_TABLET | ORAL | 0 refills | Status: DC

## 2024-11-05 NOTE — Progress Notes (Signed)

## 2024-11-25 ENCOUNTER — Ambulatory Visit
Admission: RE | Admit: 2024-11-25 | Discharge: 2024-11-25 | Disposition: A | Discharge status: Home / Self Care | Source: Ambulatory Visit | Attending: Family Medicine

## 2024-11-25 VITALS — BP 151/98 | HR 95 | Temp 98.6°F | Resp 16

## 2024-11-25 DIAGNOSIS — Z113 Encounter for screening for infections with a predominantly sexual mode of transmission: Secondary | ICD-10-CM | POA: Diagnosis present

## 2024-11-25 DIAGNOSIS — R300 Dysuria: Secondary | ICD-10-CM | POA: Insufficient documentation

## 2024-11-25 DIAGNOSIS — N898 Other specified noninflammatory disorders of vagina: Secondary | ICD-10-CM | POA: Insufficient documentation

## 2024-11-25 LAB — POCT URINE DIPSTICK
Bilirubin, UA: NEGATIVE
Blood, UA: NEGATIVE
Glucose, UA: NEGATIVE mg/dL
Ketones, POC UA: NEGATIVE mg/dL
Leukocytes, UA: NEGATIVE
Nitrite, UA: NEGATIVE
POC PROTEIN,UA: NEGATIVE
Spec Grav, UA: >=1.03 — AB (ref 1.010–1.025)
Urobilinogen, UA: 0.2 U/dL
pH, UA: 6 (ref 5.0–8.0)

## 2024-11-25 LAB — POCT URINE PREGNANCY: Preg Test, Ur: NEGATIVE

## 2024-11-25 MED ORDER — METRONIDAZOLE 500 MG PO TABS: 500.0000 mg | ORAL_TABLET | Freq: Two times a day (BID) | ORAL | 0 refills | Status: DC

## 2024-11-25 NOTE — ED Triage Notes (Signed)
 Pt present with c/o vaginal odor and itching x 1 day. Ot states she has a burning feeling when voiding. C/o vaginal discharge.

## 2024-11-25 NOTE — Discharge Instructions (Addendum)
 The clinical contact you with results of the vaginal swab/STD testing as ordered.  Start metronidazole  twice daily for 7 days for your BV type symptoms. Please follow-up with your PCP or gynecologist if your symptoms do not improve.  Please go to the ER for any worsening symptoms.  I hope you feel better soon!

## 2024-11-25 NOTE — ED Provider Notes (Signed)
 [facility]    CSN: [id number] Arrival date & time: 11/25/24  1819      History   Chief Complaint Chief Complaint  Patient presents with   Vaginal Itching    Burning sensation when I use the bathroom, little odor going on 3-4 days. - Entered by patient    HPI Teresa Peterson is a 38 y.o. female presents for vaginal discharge and dysuria.  Patient reports 4 days of a malodorous watery discharge with burning with urination.  Denies urgency, frequency, hematuria, fevers, flank pain.  Has had some nausea today.  No known STD exposure but would like screening.  Does have a history of BV and states the symptoms are consistent with BV.  Has remote history of UTIs.  No OTC medications have been used since onset.  No other concerns at this time   Vaginal Itching    Past Medical History:  Diagnosis Date   Allergy    Anemia 1988[birth year]06-12   Born anemic, grew out of it   Asthma    Depression    Emphysema of lung (HCC)    GERD (gastroesophageal reflux disease)    Headache(784.0)    Migraines w/Aura   Heart murmur    HSV infection    Hx of vaginal herpes   Noncompliance with medications    Right shoulder tendonitis 03/22/2022   STD (sexually transmitted disease) 02/11/2022   Tx'd for chlamydia x3   Tinea versicolor 11/08/2011    Patient Active Problem List   Diagnosis Date Noted   PCOS (polycystic ovarian syndrome) 06/23/2024   Sleep disorder with mood complaints 02/26/2024   Obesity (BMI 30-39.9) 02/26/2024   Chronic bilateral low back pain without sciatica 01/05/2024   Severe episode of recurrent major depressive disorder, 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 No pertinent surgical history.  OB History     Gravida  0   Para  0   Term  0   Preterm  0   AB  0   Living  0      SAB  0   IAB  0   Ectopic  0   Multiple  0   Live Births  0            Home Medications    Prior to Admission  medications   Medication Sig Start Date End Date Taking? Authorizing Provider  metroNIDAZOLE  (FLAGYL ) 500 MG tablet Take 1 tablet (500 mg total) by mouth 2 (two) times daily. 11/25/24  Yes Candela Krul, Jodi R, NP  albuterol  (VENTOLIN  HFA) 108 (90 Base) MCG/ACT inhaler Inhale 1-2 puffs into the lungs every 6 (six) hours as needed for wheezing or shortness of breath. 08/07/24   Reyes, Shaylo, PA-C  azithromycin  (ZITHROMAX ) 250 MG tablet Take 1 tablet (250 mg total) by mouth daily. Take first 2 tablets together, then 1 every day until finished. 10/14/24   Jiraiya Mcewan, Jodi R, NP  budesonide -formoterol  (SYMBICORT ) 80-4.5 MCG/ACT inhaler Inhale 2 puffs into the lungs 2 (two) times daily. 09/30/24 10/30/24  Sadarius Norman, Jodi R, NP  fexofenadine  (ALLEGRA ) 180 MG tablet Take 180 mg by mouth daily.    [provider]  fluconazole  (DIFLUCAN ) 150 MG tablet Take 1 tablet on day one, repeat on day three if needed 11/05/24   Zohra, Mobeen, PA-C  ondansetron  (ZOFRAN -ODT) 4 MG disintegrating tablet Take 1 tablet (4 mg total) by mouth every 8 (eight) hours as needed for nausea or vomiting. 09/30/24  Loreda Myla SAUNDERS, NP    Family History Family History  Problem Relation Age of Onset   Asthma Mother    Diabetes Mother    Asthma Brother    Asthma Maternal Aunt    Diabetes Maternal Aunt    Hypertension Maternal Aunt    Asthma Maternal Grandmother    Stroke Maternal Grandmother    Diabetes Maternal Grandfather    Kidney disease Maternal Grandfather    Asthma Paternal Grandmother    Alcohol abuse Other        grandparents   Arthritis Other        grandparent   Cancer Other        relative   Stroke Other        grandparent   Kidney disease Other        grandparents   Diabetes Other        grandparent    Social History Social History   Tobacco Use   Smoking status: Some Days    Types: Cigars   Smokeless tobacco: Never   Tobacco comments:    I went cold turkey in 2012 for 3 years. 2015-2022 tired to stop  countless times.  Vaping Use   Vaping status: Never Used  Substance Use Topics   Alcohol use: Not Currently    Comment: 1 bottle of wine/month   Drug use: Yes    Frequency: 3.0 times per week    Types: Marijuana     Allergies   Peanut oil, Pseudoephedrine hcl, and Sudafed [pseudoephedrine hcl]   Review of Systems Review of Systems  Genitourinary:  Positive for dysuria and vaginal discharge.     Physical Exam Triage Vital Signs ED Triage Vitals  Encounter Vitals Group     BP 11/25/24 1831 (!) 151/98     Girls Systolic BP Percentile --      Girls Diastolic BP Percentile --      Boys Systolic BP Percentile --      Boys Diastolic BP Percentile --      Pulse Rate 11/25/24 1831 95     Resp 11/25/24 1831 16     Temp 11/25/24 1831 98.6 F (37 C)     Temp src --      SpO2 11/25/24 1831 94 %     Weight --      Height --      Head Circumference --      Peak Flow --      Pain Score 11/25/24 1830 0     Pain Loc --      Pain Education --      Exclude from Growth Chart --    No data found.  Updated Vital Signs BP (!) 151/98   Pulse 95   Temp 98.6 F (37 C)   Resp 16   LMP 08/26/2024 (Exact Date)   SpO2 94%   Visual Acuity Right Eye Distance:   Left Eye Distance:   Bilateral Distance:    Right Eye Near:   Left Eye Near:    Bilateral Near:     Physical Exam Vitals and nursing note reviewed.  Constitutional:      Appearance: Normal appearance.  HENT:     Head: Normocephalic and atraumatic.  Eyes:     Pupils: Pupils are equal, round, and reactive to light.  Cardiovascular:     Rate and Rhythm: Normal rate.  Pulmonary:     Effort: Pulmonary effort is normal.  Abdominal:  Tenderness: There is no right CVA tenderness or left CVA tenderness.  Skin:    General: Skin is warm and dry.  Neurological:     General: No focal deficit present.     Mental Status: She is alert and oriented to person, place, and time.  Psychiatric:        Mood and Affect: Mood  normal.        Behavior: Behavior normal.      UC Treatments / Results  Labs (all labs ordered are listed, but only abnormal results are displayed) Labs Reviewed  POCT URINE DIPSTICK - Abnormal; Notable for the following components:      Result Value   Spec Grav, UA >=1.030 (*)    All other components within normal limits  POCT URINE PREGNANCY  CERVICOVAGINAL ANCILLARY ONLY    EKG   Radiology No results found.  Procedures Procedures (including critical care time)  Medications Ordered in UC Medications - No data to display  Initial Impression / Assessment and Plan / UC Course  I have reviewed the triage vital signs and the nursing notes.  Pertinent labs & imaging results that were available during my care of the patient were reviewed by me and considered in my medical decision making (see chart for details).     Reviewed exam and symptoms with patient.  Vaginal swab/STD testing as ordered will contact for any positive results.  Declines blood work.  Will treat for BV based on symptoms with metronidazole .  Urine negative for UTI.  Advise rest fluids and PCP or GYN follow-up if symptoms do not improve.  ER precautions reviewed. Final Clinical Impressions(s) / UC Diagnoses   Final diagnoses:  Dysuria  Vaginal discharge  Screening examination for STD (sexually transmitted disease)     Discharge Instructions      The clinical contact you with results of the vaginal swab/STD testing as ordered.  Start metronidazole  twice daily for 7 days for your BV type symptoms. Please follow-up with your PCP or gynecologist if your symptoms do not improve.  Please go to the ER for any worsening symptoms.  I hope you feel better soon!     ED Prescriptions     Medication Sig Dispense Auth. Provider   metroNIDAZOLE  (FLAGYL ) 500 MG tablet Take 1 tablet (500 mg total) by mouth 2 (two) times daily. 14 tablet Delfin Squillace, Jodi R, NP      PDMP not reviewed this encounter.   Loreda Myla SAUNDERS, NP 11/25/24 [phone]

## 2024-11-26 LAB — CERVICOVAGINAL ANCILLARY ONLY
Bacterial Vaginitis (gardnerella): NEGATIVE
Candida Glabrata: NEGATIVE
Candida Vaginitis: NEGATIVE
Chlamydia: NEGATIVE
Comment: NEGATIVE
Comment: NEGATIVE
Comment: NEGATIVE
Comment: NEGATIVE
Comment: NEGATIVE
Comment: NORMAL
Neisseria Gonorrhea: NEGATIVE
Trichomonas: NEGATIVE

## 2024-11-29 ENCOUNTER — Telehealth: Payer: Self-pay | Admitting: Family Medicine

## 2024-11-29 NOTE — Telephone Encounter (Signed)
 Called pt 2x was sent to [facility]. Left pt a detailed message about her appoitnment for tomorrow being canceled due to the weather. Patient also has [employer]. If pt calls back please let her know that we do not accept her insurance and she will need to find a office that is in her network.

## 2024-11-30 ENCOUNTER — Encounter: Payer: MEDICAID | Admitting: Obstetrics and Gynecology

## 2024-12-11 ENCOUNTER — Ambulatory Visit
Admission: RE | Admit: 2024-12-11 | Discharge: 2024-12-11 | Disposition: A | Discharge status: Home / Self Care | Payer: MEDICAID | Attending: Family Medicine | Admitting: Family Medicine

## 2024-12-11 VITALS — BP 139/75 | HR 79 | Temp 98.9°F | Resp 18 | Ht 63.0 in | Wt 215.0 lb

## 2024-12-11 DIAGNOSIS — Z3200 Encounter for pregnancy test, result unknown: Secondary | ICD-10-CM | POA: Diagnosis not present

## 2024-12-11 DIAGNOSIS — N898 Other specified noninflammatory disorders of vagina: Secondary | ICD-10-CM | POA: Diagnosis not present

## 2024-12-11 LAB — POCT URINE PREGNANCY: Preg Test, Ur: NEGATIVE

## 2024-12-11 MED ORDER — FLUCONAZOLE 200 MG PO TABS: ORAL_TABLET | ORAL | 0 refills | Status: DC

## 2024-12-11 MED ORDER — METRONIDAZOLE 500 MG PO TABS: 500.0000 mg | ORAL_TABLET | Freq: Two times a day (BID) | ORAL | 0 refills | Status: DC

## 2024-12-11 NOTE — ED Triage Notes (Addendum)
 Patient c/o vaginal irritation, runny milky vaginal discharge and vaginal itching x 2 weeks.  Patient was tested on 11/25/2024 for STIs and everything was negative.  Denies any sexual encounters since last testing.  Patient did apply Vagisil to the outside which relieved some of the irritation.  Patient request pregnancy test.

## 2024-12-11 NOTE — Discharge Instructions (Addendum)
 Advised patient urine pregnancy was negative this afternoon.  Advised patient take medications as directed with food to completion.  Encouraged to increase daily water intake to 64 ounces per day while taking these medications.  Advised if symptoms worsen and/or unresolved please follow-up with your GYN or here for further evaluation.

## 2024-12-11 NOTE — ED Provider Notes (Signed)
 " [facility]    CSN: [id number] Arrival date & time: 12/11/24  1451      History   Chief Complaint Chief Complaint  Patient presents with   Vaginal Itching    Irritation, little burning sensation when urinating about a week and a half, now accompanied with mid abdominal pain/soreness and nausea. - Entered by patient    HPI Teresa Peterson is a 38 y.o. female.   HPI 38 year old female presents with vaginal itching and discharge for 10 days.  Patient reports no sexual activity after last STD testing.  Patient request urine pregnancy test as well today.  PMH significant for asthma, obesity, and PCOS.  Past Medical History:  Diagnosis Date   Allergy    Anemia 08/30/1988   Born anemic, grew out of it   Asthma    Depression    Emphysema of lung (HCC)    GERD (gastroesophageal reflux disease)    Headache(784.0)    Migraines w/Aura   Heart murmur    HSV infection    Hx of vaginal herpes   Noncompliance with medications    Right shoulder tendonitis 03/22/2022   STD (sexually transmitted disease) 02/11/2022   Tx'd for chlamydia x3   Tinea versicolor 11/08/2011    Patient Active Problem List   Diagnosis Date Noted   PCOS (polycystic ovarian syndrome) 06/23/2024   Sleep disorder with mood complaints 02/26/2024   Obesity (BMI 30-39.9) 02/26/2024   Chronic bilateral low back pain without sciatica 01/05/2024   Severe episode of recurrent major depressive disorder, 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 No pertinent surgical history.  OB History     Gravida  0   Para  0   Term  0   Preterm  0   AB  0   Living  0      SAB  0   IAB  0   Ectopic  0   Multiple  0   Live Births  0            Home Medications    Prior to Admission medications  Medication Sig Start Date End Date Taking? Authorizing Provider  albuterol  (VENTOLIN  HFA) 108 (90 Base) MCG/ACT inhaler Inhale 1-2 puffs into the lungs  every 6 (six) hours as needed for wheezing or shortness of breath. 08/07/24  Yes Kingston Robes, PA-C  fexofenadine  (ALLEGRA ) 180 MG tablet Take 180 mg by mouth daily.   Yes [provider]  fluconazole  (DIFLUCAN ) 200 MG tablet Take 1 tab p.o. now, may repeat 1 tab p.o. in 3 days if symptoms are not resolved. 12/11/24  Yes Teddy Sharper, FNP  metroNIDAZOLE  (FLAGYL ) 500 MG tablet Take 1 tablet (500 mg total) by mouth 2 (two) times daily. 12/11/24  Yes Ellery Tash, FNP  budesonide -formoterol  (SYMBICORT ) 80-4.5 MCG/ACT inhaler Inhale 2 puffs into the lungs 2 (two) times daily. 09/30/24 10/30/24  Mayer, Jodi R, NP  ondansetron  (ZOFRAN -ODT) 4 MG disintegrating tablet Take 1 tablet (4 mg total) by mouth every 8 (eight) hours as needed for nausea or vomiting. 09/30/24   Loreda Myla SAUNDERS, NP    Family History Family History  Problem Relation Age of Onset   Asthma Mother    Diabetes Mother    Asthma Brother    Asthma Maternal Aunt    Diabetes Maternal Aunt    Hypertension Maternal Aunt    Asthma Maternal Grandmother    Stroke Maternal Grandmother  Diabetes Maternal Grandfather    Kidney disease Maternal Grandfather    Asthma Paternal Grandmother    Alcohol abuse Other        grandparents   Arthritis Other        grandparent   Cancer Other        relative   Stroke Other        grandparent   Kidney disease Other        grandparents   Diabetes Other        grandparent    Social History Social History[1]   Allergies   Peanut oil, Pseudoephedrine hcl, and Sudafed [pseudoephedrine hcl]   Review of Systems Review of Systems  Genitourinary:        Vaginal itching     Physical Exam Triage Vital Signs ED Triage Vitals  Encounter Vitals Group     BP --      Girls Systolic BP Percentile --      Girls Diastolic BP Percentile --      Boys Systolic BP Percentile --      Boys Diastolic BP Percentile --      Pulse --      Resp --      Temp --      Temp src --      SpO2 --       Weight 12/11/24 1517 215 lb (97.5 kg)     Height 12/11/24 1517 5' 3 (1.6 m)     Head Circumference --      Peak Flow --      Pain Score 12/11/24 1516 0     Pain Loc --      Pain Education --      Exclude from Growth Chart --    No data found.  Updated Vital Signs BP 139/75 (BP Location: Right Arm)   Pulse 79   Temp 98.9 F (37.2 C) (Oral)   Resp 18   Ht 5' 3 (1.6 m)   Wt 215 lb (97.5 kg)   LMP 11/26/2024 (Exact Date)   SpO2 95%   BMI 38.09 kg/m   Physical Exam Vitals and nursing note reviewed.  Constitutional:      Appearance: Normal appearance.  HENT:     Head: Normocephalic and atraumatic.     Mouth/Throat:     Mouth: Mucous membranes are moist.     Pharynx: Oropharynx is clear.  Eyes:     Extraocular Movements: Extraocular movements intact.     Conjunctiva/sclera: Conjunctivae normal.     Pupils: Pupils are equal, round, and reactive to light.  Cardiovascular:     Rate and Rhythm: Normal rate and regular rhythm.     Heart sounds: Normal heart sounds.  Pulmonary:     Effort: Pulmonary effort is normal.     Breath sounds: Normal breath sounds. No wheezing, rhonchi or rales.  Abdominal:     Tenderness: There is no right CVA tenderness or left CVA tenderness.  Musculoskeletal:        General: Normal range of motion.  Skin:    General: Skin is warm and dry.  Neurological:     General: No focal deficit present.     Mental Status: She is alert and oriented to person, place, and time. Mental status is at baseline.  Psychiatric:        Mood and Affect: Mood normal.        Behavior: Behavior normal.      UC Treatments / Results  Labs (all labs ordered are listed, but only abnormal results are displayed) Labs Reviewed  POCT URINE PREGNANCY - Normal  CERVICOVAGINAL ANCILLARY ONLY    EKG   Radiology No results found.  Procedures Procedures (including critical care time)  Medications Ordered in UC Medications - No data to display  Initial  Impression / Assessment and Plan / UC Course  I have reviewed the triage vital signs and the nursing notes.  Pertinent labs & imaging results that were available during my care of the patient were reviewed by me and considered in my medical decision making (see chart for details).     MDM: 1.  Vaginal discharge-Aptima swab ordered, Rx'd Flagyl  500 mg tablet: Take 1 tablet twice daily x 7 days; 2.  Vaginal itching-Aptima swab ordered Rx'd Diflucan  200 mg tablet: Take 1 tablet p.o. now may repeat 1 tablet p.o. in 3 days if symptoms are not resolved; 3.  Possible pregnancy, not confirmed-UA pregnancy negative today. Advised patient urine pregnancy was negative this afternoon.  Advised patient take medications as directed with food to completion.  Encouraged to increase daily water intake to 64 ounces per day while taking these medications.  Advised if symptoms worsen and/or unresolved please follow-up with your GYN or here for further evaluation. Final Clinical Impressions(s) / UC Diagnoses   Final diagnoses:  Vaginal itching  Vaginal discharge  Possible pregnancy, not confirmed     Discharge Instructions      Advised patient urine pregnancy was negative this afternoon.  Advised patient take medications as directed with food to completion.  Encouraged to increase daily water intake to 64 ounces per day while taking these medications.  Advised if symptoms worsen and/or unresolved please follow-up with your GYN or here for further evaluation.     ED Prescriptions     Medication Sig Dispense Auth. Provider   metroNIDAZOLE  (FLAGYL ) 500 MG tablet Take 1 tablet (500 mg total) by mouth 2 (two) times daily. 14 tablet Tramain Gershman, FNP   fluconazole  (DIFLUCAN ) 200 MG tablet Take 1 tab p.o. now, may repeat 1 tab p.o. in 3 days if symptoms are not resolved. 5 tablet Mauri Tolen, FNP      PDMP not reviewed this encounter.    [1]  Social History Tobacco Use   Smoking status: Some Days     Types: Cigars   Smokeless tobacco: Never   Tobacco comments:    I went cold turkey in 2012 for 3 years. 2015-2022 tired to stop countless times.  Vaping Use   Vaping status: Never Used  Substance Use Topics   Alcohol use: Not Currently    Comment: 1 bottle of wine/month   Drug use: Yes    Frequency: 3.0 times per week    Types: Marijuana     Teddy Sharper, FNP 12/11/24 1602  "

## 2024-12-13 ENCOUNTER — Ambulatory Visit (HOSPITAL_COMMUNITY): Payer: Self-pay

## 2024-12-13 LAB — CERVICOVAGINAL ANCILLARY ONLY
Bacterial Vaginitis (gardnerella): POSITIVE — AB
Candida Glabrata: NEGATIVE
Candida Vaginitis: NEGATIVE
Chlamydia: NEGATIVE
Comment: NEGATIVE
Comment: NEGATIVE
Comment: NEGATIVE
Comment: NEGATIVE
Comment: NEGATIVE
Comment: NORMAL
Neisseria Gonorrhea: NEGATIVE
Trichomonas: NEGATIVE

## 2024-12-31 ENCOUNTER — Ambulatory Visit (INDEPENDENT_AMBULATORY_CARE_PROVIDER_SITE_OTHER): Payer: MEDICAID | Admitting: Family

## 2024-12-31 ENCOUNTER — Encounter: Payer: Self-pay | Admitting: Family

## 2024-12-31 VITALS — BP 124/82 | HR 76 | Temp 97.0°F | Ht 63.0 in | Wt 213.4 lb

## 2024-12-31 DIAGNOSIS — E669 Obesity, unspecified: Secondary | ICD-10-CM | POA: Diagnosis not present

## 2024-12-31 DIAGNOSIS — Z23 Encounter for immunization: Secondary | ICD-10-CM

## 2024-12-31 DIAGNOSIS — J4540 Moderate persistent asthma, uncomplicated: Secondary | ICD-10-CM | POA: Diagnosis not present

## 2024-12-31 DIAGNOSIS — Z6837 Body mass index (BMI) 37.0-37.9, adult: Secondary | ICD-10-CM | POA: Diagnosis not present

## 2024-12-31 DIAGNOSIS — R1084 Generalized abdominal pain: Secondary | ICD-10-CM | POA: Diagnosis not present

## 2024-12-31 DIAGNOSIS — Z8639 Personal history of other endocrine, nutritional and metabolic disease: Secondary | ICD-10-CM | POA: Diagnosis not present

## 2024-12-31 DIAGNOSIS — Z0000 Encounter for general adult medical examination without abnormal findings: Secondary | ICD-10-CM

## 2024-12-31 DIAGNOSIS — Z0001 Encounter for general adult medical examination with abnormal findings: Secondary | ICD-10-CM | POA: Diagnosis not present

## 2024-12-31 MED ORDER — ALBUTEROL SULFATE HFA 108 (90 BASE) MCG/ACT IN AERS: 1.0000 | INHALATION_SPRAY | Freq: Four times a day (QID) | RESPIRATORY_TRACT | 11 refills | Status: AC | PRN

## 2024-12-31 MED ORDER — BUDESONIDE-FORMOTEROL FUMARATE 160-4.5 MCG/ACT IN AERO: 2.0000 | INHALATION_SPRAY | Freq: Two times a day (BID) | RESPIRATORY_TRACT | 12 refills | Status: AC

## 2024-12-31 NOTE — Patient Instructions (Addendum)
 It was very nice to see you today!  I will review your lab results via MyChart in a few days.  I also am sending a referral to our [facility] wellness clinic for weight loss. I have sent a referral to our Gastroenterology office regarding your stomach pain, nausea, and loose bowels. Follow up with your gynecologist at [address] [place] [address], [phone]  Happy new year! Stay well!       PLEASE NOTE:  If you had any lab tests please let us  know if you have not heard back within a few days. You may see your results on MyChart before we have a chance to review them but we will give you a call once they are reviewed by us . If we ordered any referrals today, please let us  know if you have not heard from their office within the next week.

## 2024-12-31 NOTE — Progress Notes (Unsigned)
 " Phone [phone]  Subjective:   Patient is a 39 y.o. female presenting for annual physical.    Chief Complaint  Patient presents with   Annual Exam    Non fasting w/ labs   Knee Pain    Pt c/o knee pain, present for 3 years. Has tried elevating legs and stretches from PT.   Discussed the use of AI scribe software for clinical note transcription with the patient, who gave verbal consent to proceed.  History of Present Illness Teresa Peterson is a 39 year old female who presents for an annual physical and with persistent nausea, vomiting, and bloating.  Teresa Peterson has had persistent nausea and vomiting since September, with episodes significant enough to cause her to miss work two to three times per week. Teresa Peterson feels fatigued with low energy despite quitting smoking and reducing alcohol intake.  Teresa Peterson has bloating with visible abdominal distension and constipation, describing straining despite loose stools. Symptoms occur in cycles of about three days of bloating followed by about a week and a half without symptoms. Dietary changes have provided only slight improvement.  Teresa Peterson has asthma treated with Breyna  160/4.5 mcg, two puffs twice daily, and albuterol  as needed, and Teresa Peterson feels Breyna  controls her symptoms better than Symbicort .  Teresa Peterson recently stopped smoking tobacco and has markedly reduced alcohol and marijuana use. 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 Teresa Peterson is not taking vitamin D  and has declined a flu shot this year due to feeling unwell after prior vaccination. Teresa Peterson is not exercising regularly but plans to start swimming and is concerned about her weight, which has been stable.  See problem oriented charting- ROS- full  review of systems was completed and negative except for what is noted in HPI above.  The following were reviewed and entered/updated in epic: Past Medical History:  Diagnosis Date   Allergy    Anemia 1988-04-07   Born anemic, grew out  of it   Asthma    Depression    Emphysema of lung (HCC)    GERD (gastroesophageal reflux disease)    Headache(784.0)    Migraines w/Aura   Heart murmur    HSV infection    Hx of vaginal herpes   Noncompliance with medications    Right shoulder tendonitis 03/22/2022   STD (sexually transmitted disease) 02/11/2022   Tx'd for chlamydia x3   Tinea versicolor 11/08/2011   Patient Active Problem List   Diagnosis Date Noted   PCOS (polycystic ovarian syndrome) 06/23/2024   Sleep disorder with mood complaints 02/26/2024   Obesity (BMI 30-39.9) 02/26/2024   Chronic bilateral low back pain without sciatica 01/05/2024   Severe episode of recurrent major depressive disorder, 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  Family History  Problem Relation Age of Onset   Asthma Mother    Diabetes Mother    Asthma Brother    Asthma Maternal Aunt    Diabetes Maternal Aunt    Hypertension Maternal Aunt    Asthma Maternal Grandmother    Stroke Maternal Grandmother    Diabetes Maternal Grandfather    Kidney disease Maternal Grandfather    Asthma Paternal Grandmother    Alcohol abuse Other        grandparents   Arthritis Other        grandparent   Cancer Other        relative   Stroke Other  grandparent   Kidney disease Other        grandparents   Diabetes Other        grandparent    Medications- reviewed and updated Current Outpatient Medications  Medication Sig Dispense Refill   albuterol  (VENTOLIN  HFA) 108 (90 Base) MCG/ACT inhaler Inhale 1-2 puffs into the lungs every 6 (six) hours as needed for wheezing or shortness of breath. 1 each 0   budesonide -formoterol  (SYMBICORT ) 80-4.5 MCG/ACT inhaler Inhale 2 puffs into the lungs 2 (two) times daily. 1 each 0   fexofenadine  (ALLEGRA ) 180 MG tablet Take 180 mg by mouth daily.     ondansetron  (ZOFRAN -ODT) 4 MG disintegrating tablet Take 1  tablet (4 mg total) by mouth every 8 (eight) hours as needed for nausea or vomiting. 10 tablet 0   fluconazole  (DIFLUCAN ) 200 MG tablet Take 1 tab p.o. now, may repeat 1 tab p.o. in 3 days if symptoms are not resolved. 5 tablet 0   metroNIDAZOLE  (FLAGYL ) 500 MG tablet Take 1 tablet (500 mg total) by mouth 2 (two) times daily. 14 tablet 0   No current facility-administered medications for this visit.    Allergies-reviewed and updated Allergies[1]  Social History   Social History Narrative   Alcoholic beverage: Yes      Drug use: No stopped 06/2011      Seatbeat Use: yes      Firearms in home: no      Exercise: no      Smoke Alarm in your home: Yes                   Objective:  BP 124/82 (BP Location: Left Arm, Patient Position: Sitting, Cuff Size: Large)   Pulse 76   Temp (!) 97 F (36.1 C) (Temporal)   Ht 5' 3 (1.6 m)   Wt 213 lb 6.4 oz (96.8 kg)   LMP 11/26/2024 (Exact Date)   SpO2 98%   BMI 37.80 kg/m  Physical Exam Vitals and nursing note reviewed.  Constitutional:      Appearance: Normal appearance.  HENT:     Head: Normocephalic.     Right Ear: Tympanic membrane normal.     Left Ear: Tympanic membrane normal.     Nose: Nose normal.     Mouth/Throat:     Mouth: Mucous membranes are moist.  Eyes:     Pupils: Pupils are equal, round, and reactive to light.  Cardiovascular:     Rate and Rhythm: Normal rate and regular rhythm.  Pulmonary:     Effort: Pulmonary effort is normal.     Breath sounds: Normal breath sounds.  Musculoskeletal:        General: Normal range of motion.     Cervical back: Normal range of motion.  Lymphadenopathy:     Cervical: No cervical adenopathy.  Skin:    General: Skin is warm and dry.  Neurological:     Mental Status: Teresa Peterson.  Psychiatric:        Mood and Affect: Mood normal.        Behavior: Behavior normal.     Assessment and Plan   Health Maintenance counseling: 1. Anticipatory guidance: Patient  counseled regarding regular dental exams q6 months, eye exams,  avoiding smoking and second hand smoke, limiting alcohol to 1 beverage per day, no illicit drugs.   2. Risk factor reduction:  Advised patient of need for regular exercise and diet rich with fruits and vegetables to reduce risk of heart  attack and stroke. Wt Readings from Last 3 Encounters:  12/31/24 213 lb 6.4 oz (96.8 kg)  12/11/24 215 lb (97.5 kg)  06/23/24 220 lb 6.4 oz (100 kg)   3. Immunizations/screenings/ancillary studies Immunization History  Administered Date(s) Administered   DTaP 07/07/2006   Influenza Split 11/08/2011   Influenza-Unspecified 10/23/2023   PFIZER(Purple Top)SARS-COV-2 Vaccination 11/24/2020, 12/29/2020   PPD Test 07/21/2020   Tdap 07/19/2011   Health Maintenance Due  Topic Date Due   HPV VACCINES (1 - Risk 3-dose SCDM series) Never done   Influenza Vaccine  07/23/2024    4. Cervical cancer screening: due, have sent GYN referral, giving phone # to call. 5. Skin cancer screening- advised regular sunscreen use. Denies worrisome, changing, or new skin lesions.  6. Birth control/STD check: N/A, declines need today 7. Smoking associated screening: smoker - just quit a week ago, cold turkey, no THC either 8. Alcohol screening:  none 9. Exercise:  none currently, joining gym w/cousin Assessment & Plan Moderate persistent asthma Asthma well-managed with Breyna , more effective than Symbicort . Insurance switch from Symbicort  to Breyna . - Continue Breyna  160/4.5 with refills. - Refilled albuterol  inhaler.  Obesity Weight steady with slight decrease. Interested in weight management. Stopped smoking tobacco. Referral to healthy weight and wellness clinic planned. - Referred to healthy weight and wellness clinic.  Chronic abdominal pain with gastrointestinal symptoms Intermittent abdominal pain, bloating, nausea, loose stools. Symptoms follow a recurring pattern. Previous Prilosec ineffective.  No family history of colon cancer. Referral to gastroenterology planned. - Referred to gastroenterology for further evaluation.  History of vitamin D  deficiency Vitamin D  deficiency may contribute to low energy. Not currently taking supplements. - Checked vitamin D  levels.  General Health Maintenance Discussed importance of vitamin D  for immunity and benefits of zinc and vitamin C during illness. Teresa Peterson has not received a flu shot due to past experiences of feeling sick post-vaccination. - Recommended flu shot due to upcoming peak season. - Recommended vitamin D  supplementation. - Recommended zinc and vitamin C for immunity during illness.  Recommended follow up: No follow-ups on file. Future Appointments  Date Time Provider Department Center  12/31/2024  3:00 PM Teresa Krabbe, NP [facility]    Lab/Order associations:  fasting    Teresa Krabbe, NP        [1] Allergies Allergen Reactions   Lavender Oil Anaphylaxis   Peanut Oil Anaphylaxis    peanuts   Pseudoephedrine Hcl Itching and Shortness Of Breath   Sudafed [Pseudoephedrine Hcl] Itching  "

## 2025-01-01 LAB — CBC WITH DIFFERENTIAL/PLATELET
Absolute Lymphocytes: 2536 {cells}/uL (ref 850–3900)
Absolute Monocytes: 529 {cells}/uL (ref 200–950)
Basophils Absolute: 63 {cells}/uL (ref 0–200)
Basophils Relative: 0.8 %
Eosinophils Absolute: 616 {cells}/uL — ABNORMAL HIGH (ref 15–500)
Eosinophils Relative: 7.8 %
HCT: 42 % (ref 35.9–46.0)
Hemoglobin: 13.2 g/dL (ref 11.7–15.5)
MCH: 27.9 pg (ref 27.0–33.0)
MCHC: 31.4 g/dL — ABNORMAL LOW (ref 31.6–35.4)
MCV: 88.8 fL (ref 81.4–101.7)
MPV: 10 fL (ref 7.5–12.5)
Monocytes Relative: 6.7 %
Neutro Abs: 4155 {cells}/uL (ref 1500–7800)
Neutrophils Relative %: 52.6 %
Platelets: 343 Thousand/uL (ref 140–400)
RBC: 4.73 Million/uL (ref 3.80–5.10)
RDW: 13 % (ref 11.0–15.0)
Total Lymphocyte: 32.1 %
WBC: 7.9 Thousand/uL (ref 3.8–10.8)

## 2025-01-01 LAB — COMPREHENSIVE METABOLIC PANEL WITH GFR
AG Ratio: 1.5 (calc) (ref 1.0–2.5)
ALT: 20 U/L (ref 6–29)
AST: 25 U/L (ref 10–30)
Albumin: 4.1 g/dL (ref 3.6–5.1)
Alkaline phosphatase (APISO): 46 U/L (ref 31–125)
BUN: 9 mg/dL (ref 7–25)
CO2: 28 mmol/L (ref 20–32)
Calcium: 9.2 mg/dL (ref 8.6–10.2)
Chloride: 103 mmol/L (ref 98–110)
Creat: 0.82 mg/dL (ref 0.50–0.97)
Globulin: 2.8 g/dL (ref 1.9–3.7)
Glucose, Bld: 83 mg/dL (ref 65–99)
Potassium: 3.6 mmol/L (ref 3.5–5.3)
Sodium: 138 mmol/L (ref 135–146)
Total Bilirubin: 0.5 mg/dL (ref 0.2–1.2)
Total Protein: 6.9 g/dL (ref 6.1–8.1)
eGFR: 95 mL/min/1.73m2

## 2025-01-01 LAB — VITAMIN D 25 HYDROXY (VIT D DEFICIENCY, FRACTURES): Vit D, 25-Hydroxy: 15 ng/mL — ABNORMAL LOW (ref 30–100)

## 2025-01-01 LAB — LIPID PANEL
Cholesterol: 151 mg/dL
HDL: 46 mg/dL — ABNORMAL LOW
LDL Cholesterol (Calc): 84 mg/dL
Non-HDL Cholesterol (Calc): 105 mg/dL
Total CHOL/HDL Ratio: 3.3 (calc)
Triglycerides: 114 mg/dL

## 2025-01-01 LAB — TSH: TSH: 0.91 m[IU]/L

## 2025-01-04 ENCOUNTER — Ambulatory Visit (INDEPENDENT_AMBULATORY_CARE_PROVIDER_SITE_OTHER): Payer: Self-pay | Admitting: Family

## 2025-01-04 DIAGNOSIS — M549 Dorsalgia, unspecified: Secondary | ICD-10-CM

## 2025-01-04 DIAGNOSIS — E559 Vitamin D deficiency, unspecified: Secondary | ICD-10-CM

## 2025-01-04 MED ORDER — VITAMIN D3 1.25 MG (50000 UT) PO CAPS: 1.0000 | ORAL_CAPSULE | ORAL | 0 refills | Status: DC

## 2025-01-04 MED ORDER — VITAMIN D3 1.25 MG (50000 UT) PO CAPS: 1.0000 | ORAL_CAPSULE | ORAL | 3 refills | Status: AC

## 2025-01-13 DIAGNOSIS — M549 Dorsalgia, unspecified: Secondary | ICD-10-CM

## 2025-01-13 MED ORDER — MELOXICAM 7.5 MG PO TABS: 7.5000 mg | ORAL_TABLET | Freq: Two times a day (BID) | ORAL | 0 refills | Status: AC | PRN

## 2025-01-13 MED ORDER — CYCLOBENZAPRINE HCL 5 MG PO TABS: 5.0000 mg | ORAL_TABLET | Freq: Every day | ORAL | 0 refills | Status: AC

## 2025-01-13 NOTE — Telephone Encounter (Signed)
Please see the MyChart message reply(ies) for my assessment and plan.  The patient gave consent for this Medical Advice Message and is aware that it may result in a bill to their insurance company as well as the possibility that this may result in a co-payment or deductible. They are an established patient, but are not seeking medical advice exclusively about a problem treated during an in person or video visit in the last 7 days. I did not recommend an in person or video visit within 7 days of my reply.  I spent a total of 11 minutes cumulative time within 7 days through MyChart messaging Hudnell, Stephanie, NP 

## 2025-01-18 ENCOUNTER — Telehealth: Payer: Self-pay

## 2025-01-18 ENCOUNTER — Other Ambulatory Visit (HOSPITAL_COMMUNITY): Payer: Self-pay

## 2025-01-18 NOTE — Telephone Encounter (Signed)
 Pharmacy Patient Advocate Encounter   Received notification from On[employer] CMM KEY that prior authorization for Meloxicam  7.5MG  tablets  is required/requested.   Insurance verification completed.   The patient is insured through [employer].   Per test claim: Refill too soon. PA is not needed at this time. Medication was filled 01/14/25. Next eligible fill date is 01/22/25.

## 2026-01-13 ENCOUNTER — Encounter: Admitting: Family
